# Patient Record
Sex: Male | Born: 1985
Health system: Southern US, Community
[De-identification: ages and names within clinical notes are randomized; demographics above are authoritative.]

## PROBLEM LIST (undated history)

## (undated) DIAGNOSIS — IMO0001 Reserved for inherently not codable concepts without codable children: Secondary | ICD-10-CM

## (undated) DIAGNOSIS — Z789 Other specified health status: Secondary | ICD-10-CM

## (undated) DIAGNOSIS — F419 Anxiety disorder, unspecified: Secondary | ICD-10-CM

## (undated) HISTORY — DX: Anxiety disorder, unspecified: F41.9

---

## 2018-01-01 NOTE — Progress Notes (Signed)
Chief Complaint  Patient presents with  . New Patient (Initial Visit)    ? 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 Anxiety     Current Outpatient Medications  Medication Sig Dispense Refill  . propranolol (INDERAL) 40 MG tablet Take 1 tablet (40 mg total) by mouth once for 1 dose. 30 tablet 0   No current facility-administered medications for this visit.     Allergies: No Known Allergies  History reviewed. No pertinent surgical history.  Social History   Socioeconomic History  . Marital status: Married    Spouse name: None  . Number of children: None  . Years of education: None  . Highest education level: None  Social Needs  . Financial resource strain: None  . Food insecurity - worry: None  . Food insecurity - inability: None  . Transportation needs - medical: None  . Transportation needs - non-medical: None  Occupational History  . None  Tobacco Use  . Smoking status: Never Smoker  . Smokeless tobacco: Never Used  Substance and Sexual Activity  . Alcohol use: Yes    Alcohol/week: 0.6 - 1.2 oz    Types: 1 - 2 Shots of liquor per week  . Drug use: No  . Sexual activity: Yes    Partners: Female    Birth control/protection: IUD  Other Topics Concern  . None  Social History Narrative  . None    Family History  Problem  Relation Age of Onset  . Diabetes Father   . Parkinson's disease Paternal Grandmother   . Diabetes Paternal Grandmother      ROS Review of Systems See HPI Constitution: No fevers or chills No malaise No diaphoresis Skin: No rash or itching Eyes: no blurry vision, no double vision GU: no dysuria or hematuria Neuro: no dizziness or headaches all others reviewed and negative   Objective: Vitals:   01/02/18 0818  BP: 122/70  Pulse: 92  Resp: 16  Temp: 98 F (36.7 C)  TempSrc: Oral  SpO2: 98%  Weight: 228 lb 3.2 oz (103.5 kg)  Height: 5' 7.5" (1.715 m)    Physical Exam  Constitutional: He is oriented to person, place, and time. He appears well-developed and well-nourished.  HENT:  Head: Normocephalic and atraumatic.  Eyes: Conjunctivae and EOM are normal.  Cardiovascular: Normal rate, regular rhythm and normal heart sounds.  No murmur heard. Pulmonary/Chest: Effort normal and breath sounds normal. No stridor. No respiratory distress.  Abdominal: Soft. Bowel sounds are normal. He exhibits no distension and no mass. There is no tenderness. There is no rebound and no guarding. No hernia. Hernia confirmed negative in the right inguinal area and confirmed negative in the left inguinal area.  No palpable defects  Musculoskeletal: Normal range of motion. He exhibits no edema.  Lymphadenopathy: No  inguinal adenopathy noted on the right or left side.  Neurological: He is alert and oriented to person, place, and time.  Skin: Skin is warm. Capillary refill takes less than 2 seconds.  Psychiatric: He has a normal mood and affect. His behavior is normal. Judgment and thought content normal.    Assessment and Plan Nichole was seen today for new patient (initial visit).  Diagnoses and all orders for this visit:  Hernia of abdominal wall- advised pt to return for xray since he has to leave -     DG Abd 1 View; Future  Anxiety state  Patient to continue propranolol and return  for physical  -     propranolol (INDERAL) 40 MG tablet; Take 1 tablet (40 mg total) by mouth once for 1 dose.     Eder Macek A Tasmia Blumer

## 2018-01-02 ENCOUNTER — Encounter: Payer: Self-pay | Admitting: Family Medicine

## 2018-01-02 ENCOUNTER — Ambulatory Visit (INDEPENDENT_AMBULATORY_CARE_PROVIDER_SITE_OTHER): Payer: BLUE CROSS/BLUE SHIELD | Admitting: Family Medicine

## 2018-01-02 ENCOUNTER — Other Ambulatory Visit: Payer: Self-pay

## 2018-01-02 ENCOUNTER — Ambulatory Visit: Payer: BLUE CROSS/BLUE SHIELD

## 2018-01-02 VITALS — BP 122/70 | HR 92 | Temp 98.0°F | Resp 16 | Ht 67.5 in | Wt 228.2 lb

## 2018-01-02 DIAGNOSIS — K439 Ventral hernia without obstruction or gangrene: Secondary | ICD-10-CM | POA: Diagnosis not present

## 2018-01-02 DIAGNOSIS — F411 Generalized anxiety disorder: Secondary | ICD-10-CM

## 2018-01-02 MED ORDER — PROPRANOLOL HCL 40 MG PO TABS: 40.0000 mg | ORAL_TABLET | Freq: Once | ORAL | 0 refills | Status: DC

## 2018-01-02 NOTE — Patient Instructions (Addendum)
Return for abdominal XRAY     IF you received an x-ray today, you will receive an invoice from [facility]. Please contact [facility] at [phone] with questions or concerns regarding your invoice.   IF you received labwork today, you will receive an invoice from [facility]. Please contact [facility] at [phone] with questions or concerns regarding your invoice.   Our billing staff will not be able to assist you with questions regarding bills from these companies.  You will be contacted with the lab results as soon as they are available. The fastest way to get your results is to activate your My Chart account. Instructions are located on the last page of this paperwork. If you have not heard from [place] regarding the results in 2 weeks, please contact this office.    Hernia, Adult A hernia is the bulging of an organ or tissue through a weak spot in the muscles of the abdomen (abdominal wall). Hernias develop most often near the navel or groin. There are many kinds of hernias. Common kinds include:  Femoral hernia. This kind of hernia develops under the groin in the upper thigh area.  Inguinal hernia. This kind of hernia develops in the groin or scrotum.  Umbilical hernia. This kind of hernia develops near the navel.  Hiatal hernia. This kind of hernia causes part of the stomach to be pushed up into the chest.  Incisional hernia. This kind of hernia bulges through a scar from an abdominal surgery.  What are the causes? This condition may be caused by:  Heavy lifting.  Coughing over a long period of time.  Straining to have a bowel movement.  An incision made during an abdominal surgery.  A birth defect (congenital defect).  Excess weight or obesity.  Smoking.  Poor nutrition.  Cystic fibrosis.  Excess fluid in the abdomen.  Undescended testicles.  What are the signs or symptoms? Symptoms of a hernia include:  A lump on the abdomen. This is the  first sign of a hernia. The lump may become more obvious with standing, straining, or coughing. It may get bigger over time if it is not treated or if the condition causing it is not treated.  Pain. A hernia is usually painless, but it may become painful over time if treatment is delayed. The pain is usually dull and may get worse with standing or lifting heavy objects.  Sometimes a hernia gets tightly squeezed in the weak spot (strangulated) or stuck there (incarcerated) and causes additional symptoms. These symptoms may include:  Vomiting.  Nausea.  Constipation.  Irritability.  How is this diagnosed? A hernia may be diagnosed with:  A physical exam. During the exam your health care provider may ask you to cough or to make a specific movement, because a hernia is usually more visible when you move.  Imaging tests. These can include: ? X-rays. ? Ultrasound. ? CT scan.  How is this treated? A hernia that is small and painless may not need to be treated. A hernia that is large or painful may be treated with surgery. Inguinal hernias may be treated with surgery to prevent incarceration or strangulation. Strangulated hernias are always treated with surgery, because lack of blood to the trapped organ or tissue can cause it to die. Surgery to treat a hernia involves pushing the bulge back into place and repairing the weak part of the abdomen. Follow these instructions at home:  Avoid straining.  Do not lift anything heavier than 10 lb (  4.5 kg).  Lift with your leg muscles, not your back muscles. This helps avoid strain.  When coughing, try to cough gently.  Prevent constipation. Constipation leads to straining with bowel movements, which can make a hernia worse or cause a hernia repair to break down. You can prevent constipation by: ? Eating a high-fiber diet that includes plenty of fruits and vegetables. ? Drinking enough fluids to keep your urine clear or pale yellow. Aim to drink  6-8 glasses of water per day. ? Using a stool softener as directed by your health care provider.  Lose weight, if you are overweight.  Do not use any tobacco products, including cigarettes, chewing tobacco, or electronic cigarettes. If you need help quitting, ask your health care provider.  Keep all follow-up visits as directed by your health care provider. This is important. Your health care provider may need to monitor your condition. Contact a health care provider if:  You have swelling, redness, and pain in the affected area.  Your bowel habits change. Get help right away if:  You have a fever.  You have abdominal pain that is getting worse.  You feel nauseous or you vomit.  You cannot push the hernia back in place by gently pressing on it while you are lying down.  The hernia: ? Changes in shape or size. ? Is stuck outside the abdomen. ? Becomes discolored. ? Feels hard or tender. This information is not intended to replace advice given to you by your health care provider. Make sure you discuss any questions you have with your health care provider. Document Released: 11/06/2005 Document Revised: 04/05/2016 Document Reviewed: 09/16/2014 Elsevier Interactive Patient Education  2017 [employer]

## 2018-02-01 ENCOUNTER — Other Ambulatory Visit: Payer: Self-pay | Admitting: Family Medicine

## 2018-04-01 ENCOUNTER — Encounter: Payer: BLUE CROSS/BLUE SHIELD | Admitting: Family Medicine

## 2018-04-29 ENCOUNTER — Other Ambulatory Visit: Payer: Self-pay | Admitting: Family Medicine

## 2018-04-29 NOTE — Telephone Encounter (Signed)
Last visit 01/02/18 No upcoming visits Last fill date 02/01/18

## 2018-04-29 NOTE — Telephone Encounter (Signed)
Inderal refill Last OV:01/02/18 Last refill:02/01/18 30 tab/0 refill MWN:[id number]:[phone][id number] [place]- [place], [place] - [address] [phone] (Phone) [phone] (Fax)

## 2018-10-28 ENCOUNTER — Encounter: Payer: Self-pay | Admitting: Family Medicine

## 2018-10-28 ENCOUNTER — Ambulatory Visit: Payer: BLUE CROSS/BLUE SHIELD | Admitting: Family Medicine

## 2018-10-28 ENCOUNTER — Other Ambulatory Visit: Payer: Self-pay

## 2018-10-28 VITALS — BP 139/83 | HR 74 | Temp 98.6°F | Resp 17 | Ht 67.5 in | Wt 226.0 lb

## 2018-10-28 DIAGNOSIS — Z113 Encounter for screening for infections with a predominantly sexual mode of transmission: Secondary | ICD-10-CM

## 2018-10-28 DIAGNOSIS — F4010 Social phobia, unspecified: Secondary | ICD-10-CM | POA: Insufficient documentation

## 2018-10-28 DIAGNOSIS — N489 Disorder of penis, unspecified: Secondary | ICD-10-CM | POA: Diagnosis not present

## 2018-10-28 MED ORDER — PROPRANOLOL HCL 40 MG PO TABS: 40.0000 mg | ORAL_TABLET | Freq: Every day | ORAL | 1 refills | Status: AC

## 2018-10-28 MED ORDER — DOXYCYCLINE HYCLATE 100 MG PO TABS: 100.0000 mg | ORAL_TABLET | Freq: Two times a day (BID) | ORAL | 0 refills | Status: AC

## 2018-10-28 NOTE — Progress Notes (Signed)
Established Patient Office Visit  Subjective:  Patient ID: Jonathan Garcia, male    DOB: [birth year]  Age: 32 y.o. MRN: [id number]  CC:  Chief Complaint  Patient presents with  . bump in groin (mid groin)    x 6 days, painful, pain level 8/10 and ibuprofen last night at 7 pm for pain.  . Medication Refill    propranolol    HPI Jonathan Garcia presents for  Penile lesion Patient reports that he started having a lesion on his penis for a week now States that he was having pain from the lesion so he squeezed it and having pain and discharge He is monogamous with his wife and she has been his only partner   Social anxiety Pt takes propranolol as needed for speeches or new encounters She reports that this helps him greatly Denies dizziness with meds  Past Medical History:  Diagnosis Date  . Anxiety     No past surgical history on file.  Family History  Problem Relation Age of Onset  . Diabetes Father   . Parkinson's disease Paternal Grandmother   . Diabetes Paternal Grandmother     Social History   Socioeconomic History  . Marital status: Married    Spouse name: Not on file  . Number of children: Not on file  . Years of education: Not on file  . Highest education level: Not on file  Occupational History  . Not on file  Social Needs  . Financial resource strain: Not on file  . Food insecurity:    Worry: Not on file    Inability: Not on file  . Transportation needs:    Medical: Not on file    Non-medical: Not on file  Tobacco Use  . Smoking status: Never Smoker  . Smokeless tobacco: Never Used  Substance and Sexual Activity  . Alcohol use: Yes    Alcohol/week: 1.0 - 2.0 standard drinks    Types: 1 - 2 Shots of liquor per week  . Drug use: No  . Sexual activity: Yes    Partners: Female    Birth control/protection: IUD  Lifestyle  . Physical activity:    Days per week: Not on file    Minutes per session: Not on file  . Stress: Not on file    Relationships  . Social connections:    Talks on phone: Not on file    Gets together: Not on file    Attends religious service: Not on file    Active member of club or organization: Not on file    Attends meetings of clubs or organizations: Not on file    Relationship status: Not on file  . Intimate partner violence:    Fear of current or ex partner: Not on file    Emotionally abused: Not on file    Physically abused: Not on file    Forced sexual activity: Not on file  Other Topics Concern  . Not on file  Social History Narrative  . Not on file    Outpatient Medications Prior to Visit  Medication Sig Dispense Refill  . propranolol (INDERAL) 40 MG tablet TAKE 1 TABLET BY MOUTH EVERY DAY 30 tablet 1   No facility-administered medications prior to visit.     No Known Allergies  ROS Review of Systems Review of Systems  Constitutional: Negative for activity change, appetite change, chills and fever.  HENT: Negative for congestion, nosebleeds, trouble swallowing and voice change.   Respiratory: Negative for  cough, shortness of breath and wheezing.   Gastrointestinal: Negative for diarrhea, nausea and vomiting.  Genitourinary: Negative for difficulty urinating, dysuria, flank pain and hematuria.  Musculoskeletal: Negative for back pain, joint swelling and neck pain.  Neurological: Negative for dizziness, speech difficulty, light-headedness and numbness.  See HPI. All other review of systems negative.     Objective:    Physical Exam  BP 139/83 (BP Location: Left Arm, Patient Position: Sitting, Cuff Size: Large)   Pulse 74   Temp 98.6 F (37 C) (Oral)   Resp 17   Ht 5' 7.5" (1.715 m)   Wt 226 lb (102.5 kg)   SpO2 100%   BMI 34.87 kg/m  Wt Readings from Last 3 Encounters:  10/28/18 226 lb (102.5 kg)  01/02/18 228 lb 3.2 oz (103.5 kg)   Physical Exam  Constitutional: Oriented to person, place, and time. Appears well-developed and well-nourished.  HENT:  Head:  Normocephalic and atraumatic.  Eyes: Conjunctivae and EOM are normal.  Cardiovascular: Normal rate, regular rhythm, normal heart sounds and intact distal pulses.  No murmur heard. Pulmonary/Chest: Effort normal and breath sounds normal. No stridor. No respiratory distress. Has no wheezes.  Neurological: Is alert and oriented to person, place, and time.  Skin: Skin is warm. Capillary refill takes less than 2 seconds.  Psychiatric: Has a normal mood and affect. Behavior is normal. Judgment and thought content normal.   Chaperone present  At the glans of the circumcised penis there is a cauliflower appearing lesion with some evidence of clotting With palpation there was yellow exudate and blood expressed Culture was collected  The lesion is on the left lateral aspect of the glans at 9o'clock With palpation of the scrotom there are no lesions, there are no masses in the inguinal canal Palpable inguinal lymph node noted on the left No ulcerations or vesicles noted No discharge from the penis  Health Maintenance Due  Topic Date Due  . HIV Screening  11/18/2001    There are no preventive care reminders to display for this patient.  No results found for: TSH No results found for: WBC, HGB, HCT, MCV, PLT No results found for: NA, K, CHLORIDE, CO2, GLUCOSE, BUN, CREATININE, BILITOT, ALKPHOS, AST, ALT, PROT, ALBUMIN, CALCIUM, ANIONGAP, EGFR, GFR No results found for: CHOL No results found for: HDL No results found for: LDLCALC No results found for: TRIG No results found for: CHOLHDL No results found for: HGBA1C    Assessment & Plan:   Problem List Items Addressed This Visit      Other   Social anxiety disorder    Refilled propranolol This is working well for him      Penile lesion - Primary    Will screen for stds and also send wound culture  Will await results for treatment      Relevant Medications   doxycycline (VIBRA-TABS) 100 MG tablet   Other Relevant Orders    Herpes simplex virus culture   HSV(herpes simplex vrs) 1+2 ab-IgG   WOUND CULTURE    Other Visit Diagnoses    Screening for STD (sexually transmitted disease)       Relevant Orders   Herpes simplex virus culture   HIV Antibody (routine testing w rflx)   HSV(herpes simplex vrs) 1+2 ab-IgG   Hepatitis B surface antigen   RPR   WOUND CULTURE   GC/Chlamydia Probe Amp      Meds ordered this encounter  Medications  . doxycycline (VIBRA-TABS) 100 MG tablet  Sig: Take 1 tablet (100 mg total) by mouth 2 (two) times daily for 10 days.    Dispense:  20 tablet    Refill:  0  . propranolol (INDERAL) 40 MG tablet    Sig: Take 1 tablet (40 mg total) by mouth daily.    Dispense:  30 tablet    Refill:  1    Need office visit for additional refills.    Follow-up: No follow-ups on file.    Forrest Moron, MD

## 2018-10-28 NOTE — Assessment & Plan Note (Signed)
Refilled propranolol This is working well for him

## 2018-10-28 NOTE — Assessment & Plan Note (Signed)
Will screen for stds and also send wound culture  Will await results for treatment

## 2018-10-28 NOTE — Patient Instructions (Signed)
° ° ° °  If you have lab work done today you will be contacted with your lab results within the next 2 weeks.  If you have not heard from us then please contact us. The fastest way to get your results is to register for My Chart. ° ° °IF you received an x-ray today, you will receive an invoice from [facility]. Please contact [facility] at [phone] with questions or concerns regarding your invoice.  ° °IF you received labwork today, you will receive an invoice from [facility]. Please contact [facility] at [phone] with questions or concerns regarding your invoice.  ° °Our billing staff will not be able to assist you with questions regarding bills from these companies. ° °You will be contacted with the lab results as soon as they are available. The fastest way to get your results is to activate your My Chart account. Instructions are located on the last page of this paperwork. If you have not heard from us regarding the results in 2 weeks, please contact this office. °  ° ° ° °

## 2018-10-29 LAB — HSV(HERPES SIMPLEX VRS) I + II AB-IGG
HSV 1 Glycoprotein G Ab, IgG: <0.91 index (ref 0.00–0.90)
HSV 2 IgG, Type Spec: <0.91 index (ref 0.00–0.90)

## 2018-10-29 LAB — RPR: RPR: NONREACTIVE

## 2018-10-29 LAB — HEPATITIS B SURFACE ANTIGEN: HEP B S AG: NEGATIVE

## 2018-10-29 LAB — HIV ANTIBODY (ROUTINE TESTING W REFLEX): HIV Screen 4th Generation wRfx: NONREACTIVE

## 2018-10-30 LAB — WOUND CULTURE

## 2018-10-30 LAB — HERPES SIMPLEX VIRUS CULTURE

## 2018-10-30 LAB — GC/CHLAMYDIA PROBE AMP
Chlamydia trachomatis, NAA: NEGATIVE
Neisseria gonorrhoeae by PCR: NEGATIVE

## 2019-09-08 ENCOUNTER — Other Ambulatory Visit: Payer: Self-pay

## 2019-09-08 ENCOUNTER — Ambulatory Visit: Payer: BC Managed Care – PPO | Admitting: Family Medicine

## 2019-09-08 ENCOUNTER — Encounter: Payer: Self-pay | Admitting: Family Medicine

## 2019-09-08 VITALS — BP 135/79 | HR 85 | Temp 98.4°F | Resp 16 | Ht 67.72 in | Wt 223.0 lb

## 2019-09-08 DIAGNOSIS — M6289 Other specified disorders of muscle: Secondary | ICD-10-CM

## 2019-09-08 DIAGNOSIS — Z3009 Encounter for other general counseling and advice on contraception: Secondary | ICD-10-CM | POA: Diagnosis not present

## 2019-09-08 NOTE — Progress Notes (Signed)
Established Patient Office Visit  Subjective:  Patient ID: Jonathan Garcia, male    DOB: 1986-11-11  Age: 33 y.o. MRN: [id number]  CC:  Chief Complaint  Patient presents with  . Leg Pain    in both legs x 4 months. Pt states he feels like it is poor blood cirulation. More at the calf area     HPI Jonathan Garcia presents for   Pt reports that he feels like his legs get tired and feels like his legs are tight and he is standing 6 out of 8 hours He states that his legs feel tired and sometimes he misses steps going up He reports that at the top of the steps his legs wear out  He reports that any strenuous activities and the legs feel tired  He denies trembling in his legs He only feels this in the calves He states that his father had circulation issues with his legs and he is concerned given his sickle cell trait.   He denies hip and groin pain  His toes do not turn blue or get discolored  Contraception Patient reports that he and his wife have discussed permanent contraception He is very sure he would want to proceed with vasectomy and desires a referral He has never wanted children and is aware this permanent   Past Medical History:  Diagnosis Date  . Anxiety     History reviewed. No pertinent surgical history.  Family History  Problem Relation Age of Onset  . Diabetes Father   . Parkinson's disease Paternal Grandmother   . Diabetes Paternal Grandmother     Social History   Socioeconomic History  . Marital status: Married    Spouse name: Not on file  . Number of children: Not on file  . Years of education: Not on file  . Highest education level: Not on file  Occupational History  . Not on file  Social Needs  . Financial resource strain: Not on file  . Food insecurity    Worry: Not on file    Inability: Not on file  . Transportation needs    Medical: Not on file    Non-medical: Not on file  Tobacco Use  . Smoking status: Never Smoker  . Smokeless  tobacco: Never Used  Substance and Sexual Activity  . Alcohol use: Yes    Alcohol/week: 1.0 - 2.0 standard drinks    Types: 1 - 2 Shots of liquor per week  . Drug use: No  . Sexual activity: Yes    Partners: Female    Birth control/protection: I.U.D.  Lifestyle  . Physical activity    Days per week: Not on file    Minutes per session: Not on file  . Stress: Not on file  Relationships  . Social Herbalist on phone: Not on file    Gets together: Not on file    Attends religious service: Not on file    Active member of club or organization: Not on file    Attends meetings of clubs or organizations: Not on file    Relationship status: Not on file  . Intimate partner violence    Fear of current or ex partner: Not on file    Emotionally abused: Not on file    Physically abused: Not on file    Forced sexual activity: Not on file  Other Topics Concern  . Not on file  Social History Narrative  . Not on file  Outpatient Medications Prior to Visit  Medication Sig Dispense Refill  . propranolol (INDERAL) 40 MG tablet Take 1 tablet (40 mg total) by mouth daily. 30 tablet 1   No facility-administered medications prior to visit.     No Known Allergies  ROS Review of Systems Review of Systems  Constitutional: Negative for activity change, appetite change, chills and fever.  HENT: Negative for congestion, nosebleeds, trouble swallowing and voice change.   Respiratory: Negative for cough, shortness of breath and wheezing.   Gastrointestinal: Negative for diarrhea, nausea and vomiting.  Genitourinary: Negative for difficulty urinating, dysuria, flank pain and hematuria.  Musculoskeletal: Negative for back pain, joint swelling and neck pain.  Neurological: Negative for dizziness, speech difficulty, light-headedness and numbness.  See HPI. All other review of systems negative.     Objective:    Physical Exam  BP 135/79   Pulse 85   Temp 98.4 F (36.9 C) (Oral)    Resp 16   Ht 5' 7.72" (1.72 m)   Wt 223 lb (101.2 kg)   SpO2 99%   BMI 34.19 kg/m  Wt Readings from Last 3 Encounters:  09/08/19 223 lb (101.2 kg)  10/28/18 226 lb (102.5 kg)  01/02/18 228 lb 3.2 oz (103.5 kg)   Physical Exam  Constitutional: Oriented to person, place, and time. Appears well-developed and well-nourished.  HENT:  Head: Normocephalic and atraumatic.  Eyes: Conjunctivae and EOM are normal.  Cardiovascular: Normal rate, regular rhythm, normal heart sounds and intact distal pulses.  No murmur heard. Pulmonary/Chest: Effort normal and breath sounds normal. No stridor. No respiratory distress. Has no wheezes.  Musculoskeletal: no calf pain, cap refill <2s, no cyanosis, no clubbing Neurological: Is alert and oriented to person, place, and time.  Skin: Skin is warm. Capillary refill takes less than 2 seconds.  Psychiatric: Has a normal mood and affect. Behavior is normal. Judgment and thought content normal.    There are no preventive care reminders to display for this patient.  There are no preventive care reminders to display for this patient.     Assessment & Plan:   Problem List Items Addressed This Visit    None    Visit Diagnoses    Muscle fatigue    -  Primary -  Discussed electrolyte deficiency as a cause  -  Discussed that that he should get his labs checked to look for deficiency His sickle cell trait is not a likely cause    Relevant Orders   Basic metabolic panel   Magnesium   Phosphorus   Encounter for vasectomy counseling    - discussed that this is a permanent form of contraception  Discussed that the expected course and also discussed risks of vasectomy He is well aware and confident in his decision   Relevant Orders   Ambulatory referral to Urology      No orders of the defined types were placed in this encounter.   Follow-up: No follow-ups on file.    Doristine Bosworth, MD

## 2019-09-08 NOTE — Patient Instructions (Signed)
° ° ° °  If you have lab work done today you will be contacted with your lab results within the next 2 weeks.  If you have not heard from us then please contact us. The fastest way to get your results is to register for My Chart. ° ° °IF you received an x-ray today, you will receive an invoice from [facility]. Please contact [facility] at [phone] with questions or concerns regarding your invoice.  ° °IF you received labwork today, you will receive an invoice from [facility]. Please contact [facility] at [phone] with questions or concerns regarding your invoice.  ° °Our billing staff will not be able to assist you with questions regarding bills from these companies. ° °You will be contacted with the lab results as soon as they are available. The fastest way to get your results is to activate your My Chart account. Instructions are located on the last page of this paperwork. If you have not heard from us regarding the results in 2 weeks, please contact this office. °  ° ° ° °

## 2019-09-09 LAB — BASIC METABOLIC PANEL
BUN/Creatinine Ratio: 12 (ref 9–20)
BUN: 15 mg/dL (ref 6–20)
CO2: 24 mmol/L (ref 20–29)
Calcium: 9.6 mg/dL (ref 8.7–10.2)
Chloride: 100 mmol/L (ref 96–106)
Creatinine, Ser: 1.24 mg/dL (ref 0.76–1.27)
GFR calc Af Amer: 88 mL/min/{1.73_m2} (ref >59)
GFR calc non Af Amer: 76 mL/min/{1.73_m2} (ref >59)
Glucose: 88 mg/dL (ref 65–99)
Potassium: 4.1 mmol/L (ref 3.5–5.2)
Sodium: 140 mmol/L (ref 134–144)

## 2019-09-09 LAB — MAGNESIUM: Magnesium: 2 mg/dL (ref 1.6–2.3)

## 2019-09-09 LAB — CBC
Hematocrit: 42.4 % (ref 37.5–51.0)
Hemoglobin: 13.7 g/dL (ref 13.0–17.7)
MCH: 26.6 pg (ref 26.6–33.0)
MCHC: 32.3 g/dL (ref 31.5–35.7)
MCV: 82 fL (ref 79–97)
Platelets: 338 10*3/uL (ref 150–450)
RBC: 5.15 x10E6/uL (ref 4.14–5.80)
RDW: 11.4 % — ABNORMAL LOW (ref 11.6–15.4)
WBC: 10 10*3/uL (ref 3.4–10.8)

## 2019-09-09 LAB — PHOSPHORUS: Phosphorus: 3.9 mg/dL (ref 2.8–4.1)

## 2019-09-30 DIAGNOSIS — Z3009 Encounter for other general counseling and advice on contraception: Secondary | ICD-10-CM | POA: Diagnosis not present

## 2019-11-25 DIAGNOSIS — Z1159 Encounter for screening for other viral diseases: Secondary | ICD-10-CM | POA: Diagnosis not present

## 2019-11-25 DIAGNOSIS — Z20828 Contact with and (suspected) exposure to other viral communicable diseases: Secondary | ICD-10-CM | POA: Diagnosis not present

## 2019-12-08 DIAGNOSIS — Z20828 Contact with and (suspected) exposure to other viral communicable diseases: Secondary | ICD-10-CM | POA: Diagnosis not present

## 2019-12-08 DIAGNOSIS — Z1159 Encounter for screening for other viral diseases: Secondary | ICD-10-CM | POA: Diagnosis not present

## 2019-12-11 DIAGNOSIS — Z1159 Encounter for screening for other viral diseases: Secondary | ICD-10-CM | POA: Diagnosis not present

## 2019-12-11 DIAGNOSIS — Z20828 Contact with and (suspected) exposure to other viral communicable diseases: Secondary | ICD-10-CM | POA: Diagnosis not present

## 2019-12-15 DIAGNOSIS — Z20828 Contact with and (suspected) exposure to other viral communicable diseases: Secondary | ICD-10-CM | POA: Diagnosis not present

## 2019-12-15 DIAGNOSIS — Z1159 Encounter for screening for other viral diseases: Secondary | ICD-10-CM | POA: Diagnosis not present

## 2019-12-18 DIAGNOSIS — Z1159 Encounter for screening for other viral diseases: Secondary | ICD-10-CM | POA: Diagnosis not present

## 2019-12-18 DIAGNOSIS — Z20828 Contact with and (suspected) exposure to other viral communicable diseases: Secondary | ICD-10-CM | POA: Diagnosis not present

## 2019-12-22 DIAGNOSIS — Z1159 Encounter for screening for other viral diseases: Secondary | ICD-10-CM | POA: Diagnosis not present

## 2019-12-22 DIAGNOSIS — Z20828 Contact with and (suspected) exposure to other viral communicable diseases: Secondary | ICD-10-CM | POA: Diagnosis not present

## 2019-12-25 DIAGNOSIS — Z20828 Contact with and (suspected) exposure to other viral communicable diseases: Secondary | ICD-10-CM | POA: Diagnosis not present

## 2019-12-25 DIAGNOSIS — Z1159 Encounter for screening for other viral diseases: Secondary | ICD-10-CM | POA: Diagnosis not present

## 2019-12-29 DIAGNOSIS — Z1159 Encounter for screening for other viral diseases: Secondary | ICD-10-CM | POA: Diagnosis not present

## 2019-12-29 DIAGNOSIS — Z20828 Contact with and (suspected) exposure to other viral communicable diseases: Secondary | ICD-10-CM | POA: Diagnosis not present

## 2020-01-01 DIAGNOSIS — Z20828 Contact with and (suspected) exposure to other viral communicable diseases: Secondary | ICD-10-CM | POA: Diagnosis not present

## 2020-01-01 DIAGNOSIS — Z1159 Encounter for screening for other viral diseases: Secondary | ICD-10-CM | POA: Diagnosis not present

## 2020-01-05 DIAGNOSIS — Z20828 Contact with and (suspected) exposure to other viral communicable diseases: Secondary | ICD-10-CM | POA: Diagnosis not present

## 2020-01-05 DIAGNOSIS — Z1159 Encounter for screening for other viral diseases: Secondary | ICD-10-CM | POA: Diagnosis not present

## 2020-01-12 DIAGNOSIS — Z1159 Encounter for screening for other viral diseases: Secondary | ICD-10-CM | POA: Diagnosis not present

## 2020-01-12 DIAGNOSIS — Z20828 Contact with and (suspected) exposure to other viral communicable diseases: Secondary | ICD-10-CM | POA: Diagnosis not present

## 2020-02-02 DIAGNOSIS — Z20828 Contact with and (suspected) exposure to other viral communicable diseases: Secondary | ICD-10-CM | POA: Diagnosis not present

## 2020-02-02 DIAGNOSIS — Z1159 Encounter for screening for other viral diseases: Secondary | ICD-10-CM | POA: Diagnosis not present

## 2020-02-12 DIAGNOSIS — Z20828 Contact with and (suspected) exposure to other viral communicable diseases: Secondary | ICD-10-CM | POA: Diagnosis not present

## 2020-02-12 DIAGNOSIS — Z1159 Encounter for screening for other viral diseases: Secondary | ICD-10-CM | POA: Diagnosis not present

## 2020-02-26 DIAGNOSIS — Z20828 Contact with and (suspected) exposure to other viral communicable diseases: Secondary | ICD-10-CM | POA: Diagnosis not present

## 2020-02-26 DIAGNOSIS — Z1159 Encounter for screening for other viral diseases: Secondary | ICD-10-CM | POA: Diagnosis not present

## 2020-03-01 DIAGNOSIS — Z20828 Contact with and (suspected) exposure to other viral communicable diseases: Secondary | ICD-10-CM | POA: Diagnosis not present

## 2020-03-01 DIAGNOSIS — Z1159 Encounter for screening for other viral diseases: Secondary | ICD-10-CM | POA: Diagnosis not present

## 2020-03-18 DIAGNOSIS — Z1159 Encounter for screening for other viral diseases: Secondary | ICD-10-CM | POA: Diagnosis not present

## 2020-03-18 DIAGNOSIS — Z20828 Contact with and (suspected) exposure to other viral communicable diseases: Secondary | ICD-10-CM | POA: Diagnosis not present

## 2020-03-22 DIAGNOSIS — Z1159 Encounter for screening for other viral diseases: Secondary | ICD-10-CM | POA: Diagnosis not present

## 2020-03-22 DIAGNOSIS — Z20828 Contact with and (suspected) exposure to other viral communicable diseases: Secondary | ICD-10-CM | POA: Diagnosis not present

## 2020-04-05 DIAGNOSIS — Z20828 Contact with and (suspected) exposure to other viral communicable diseases: Secondary | ICD-10-CM | POA: Diagnosis not present

## 2020-04-05 DIAGNOSIS — Z1159 Encounter for screening for other viral diseases: Secondary | ICD-10-CM | POA: Diagnosis not present

## 2020-04-12 DIAGNOSIS — Z20828 Contact with and (suspected) exposure to other viral communicable diseases: Secondary | ICD-10-CM | POA: Diagnosis not present

## 2020-04-12 DIAGNOSIS — Z1159 Encounter for screening for other viral diseases: Secondary | ICD-10-CM | POA: Diagnosis not present

## 2020-04-21 DIAGNOSIS — Z3040 Encounter for surveillance of contraceptives, unspecified: Secondary | ICD-10-CM | POA: Diagnosis not present

## 2020-04-21 DIAGNOSIS — N478 Other disorders of prepuce: Secondary | ICD-10-CM | POA: Diagnosis not present

## 2020-04-21 DIAGNOSIS — Z760 Encounter for issue of repeat prescription: Secondary | ICD-10-CM | POA: Diagnosis not present

## 2020-04-22 DIAGNOSIS — Z1159 Encounter for screening for other viral diseases: Secondary | ICD-10-CM | POA: Diagnosis not present

## 2020-04-22 DIAGNOSIS — Z20828 Contact with and (suspected) exposure to other viral communicable diseases: Secondary | ICD-10-CM | POA: Diagnosis not present

## 2020-04-29 DIAGNOSIS — Z1159 Encounter for screening for other viral diseases: Secondary | ICD-10-CM | POA: Diagnosis not present

## 2020-04-29 DIAGNOSIS — Z20828 Contact with and (suspected) exposure to other viral communicable diseases: Secondary | ICD-10-CM | POA: Diagnosis not present

## 2020-05-13 DIAGNOSIS — Z1159 Encounter for screening for other viral diseases: Secondary | ICD-10-CM | POA: Diagnosis not present

## 2020-05-13 DIAGNOSIS — Z20828 Contact with and (suspected) exposure to other viral communicable diseases: Secondary | ICD-10-CM | POA: Diagnosis not present

## 2020-05-17 DIAGNOSIS — Z1159 Encounter for screening for other viral diseases: Secondary | ICD-10-CM | POA: Diagnosis not present

## 2020-05-17 DIAGNOSIS — Z20828 Contact with and (suspected) exposure to other viral communicable diseases: Secondary | ICD-10-CM | POA: Diagnosis not present

## 2020-05-27 DIAGNOSIS — Z1159 Encounter for screening for other viral diseases: Secondary | ICD-10-CM | POA: Diagnosis not present

## 2020-05-27 DIAGNOSIS — Z20828 Contact with and (suspected) exposure to other viral communicable diseases: Secondary | ICD-10-CM | POA: Diagnosis not present

## 2020-06-07 DIAGNOSIS — Z1159 Encounter for screening for other viral diseases: Secondary | ICD-10-CM | POA: Diagnosis not present

## 2020-06-07 DIAGNOSIS — Z20828 Contact with and (suspected) exposure to other viral communicable diseases: Secondary | ICD-10-CM | POA: Diagnosis not present

## 2020-06-14 DIAGNOSIS — Z1159 Encounter for screening for other viral diseases: Secondary | ICD-10-CM | POA: Diagnosis not present

## 2020-06-14 DIAGNOSIS — Z20828 Contact with and (suspected) exposure to other viral communicable diseases: Secondary | ICD-10-CM | POA: Diagnosis not present

## 2020-06-21 DIAGNOSIS — Z1159 Encounter for screening for other viral diseases: Secondary | ICD-10-CM | POA: Diagnosis not present

## 2020-06-21 DIAGNOSIS — Z20828 Contact with and (suspected) exposure to other viral communicable diseases: Secondary | ICD-10-CM | POA: Diagnosis not present

## 2020-06-24 DIAGNOSIS — Z1159 Encounter for screening for other viral diseases: Secondary | ICD-10-CM | POA: Diagnosis not present

## 2020-06-24 DIAGNOSIS — Z20828 Contact with and (suspected) exposure to other viral communicable diseases: Secondary | ICD-10-CM | POA: Diagnosis not present

## 2020-07-05 DIAGNOSIS — Z1159 Encounter for screening for other viral diseases: Secondary | ICD-10-CM | POA: Diagnosis not present

## 2020-07-05 DIAGNOSIS — Z20828 Contact with and (suspected) exposure to other viral communicable diseases: Secondary | ICD-10-CM | POA: Diagnosis not present

## 2020-10-21 DIAGNOSIS — Z1159 Encounter for screening for other viral diseases: Secondary | ICD-10-CM | POA: Diagnosis not present

## 2020-10-21 DIAGNOSIS — Z20828 Contact with and (suspected) exposure to other viral communicable diseases: Secondary | ICD-10-CM | POA: Diagnosis not present

## 2020-10-25 DIAGNOSIS — Z1159 Encounter for screening for other viral diseases: Secondary | ICD-10-CM | POA: Diagnosis not present

## 2020-10-25 DIAGNOSIS — Z20828 Contact with and (suspected) exposure to other viral communicable diseases: Secondary | ICD-10-CM | POA: Diagnosis not present

## 2020-11-01 DIAGNOSIS — Z1159 Encounter for screening for other viral diseases: Secondary | ICD-10-CM | POA: Diagnosis not present

## 2020-11-01 DIAGNOSIS — Z20828 Contact with and (suspected) exposure to other viral communicable diseases: Secondary | ICD-10-CM | POA: Diagnosis not present

## 2020-11-11 DIAGNOSIS — Z1159 Encounter for screening for other viral diseases: Secondary | ICD-10-CM | POA: Diagnosis not present

## 2020-11-11 DIAGNOSIS — Z20828 Contact with and (suspected) exposure to other viral communicable diseases: Secondary | ICD-10-CM | POA: Diagnosis not present

## 2020-11-15 DIAGNOSIS — Z1159 Encounter for screening for other viral diseases: Secondary | ICD-10-CM | POA: Diagnosis not present

## 2020-11-15 DIAGNOSIS — Z20828 Contact with and (suspected) exposure to other viral communicable diseases: Secondary | ICD-10-CM | POA: Diagnosis not present

## 2020-11-22 DIAGNOSIS — Z1159 Encounter for screening for other viral diseases: Secondary | ICD-10-CM | POA: Diagnosis not present

## 2020-11-22 DIAGNOSIS — Z20828 Contact with and (suspected) exposure to other viral communicable diseases: Secondary | ICD-10-CM | POA: Diagnosis not present

## 2020-11-29 DIAGNOSIS — Z20828 Contact with and (suspected) exposure to other viral communicable diseases: Secondary | ICD-10-CM | POA: Diagnosis not present

## 2020-11-29 DIAGNOSIS — Z1159 Encounter for screening for other viral diseases: Secondary | ICD-10-CM | POA: Diagnosis not present

## 2020-12-02 DIAGNOSIS — Z1159 Encounter for screening for other viral diseases: Secondary | ICD-10-CM | POA: Diagnosis not present

## 2020-12-02 DIAGNOSIS — Z20828 Contact with and (suspected) exposure to other viral communicable diseases: Secondary | ICD-10-CM | POA: Diagnosis not present

## 2020-12-06 DIAGNOSIS — Z20828 Contact with and (suspected) exposure to other viral communicable diseases: Secondary | ICD-10-CM | POA: Diagnosis not present

## 2020-12-06 DIAGNOSIS — Z1159 Encounter for screening for other viral diseases: Secondary | ICD-10-CM | POA: Diagnosis not present

## 2020-12-13 DIAGNOSIS — Z1159 Encounter for screening for other viral diseases: Secondary | ICD-10-CM | POA: Diagnosis not present

## 2020-12-13 DIAGNOSIS — Z20828 Contact with and (suspected) exposure to other viral communicable diseases: Secondary | ICD-10-CM | POA: Diagnosis not present

## 2020-12-16 DIAGNOSIS — Z20828 Contact with and (suspected) exposure to other viral communicable diseases: Secondary | ICD-10-CM | POA: Diagnosis not present

## 2020-12-16 DIAGNOSIS — Z1159 Encounter for screening for other viral diseases: Secondary | ICD-10-CM | POA: Diagnosis not present

## 2020-12-30 DIAGNOSIS — Z20828 Contact with and (suspected) exposure to other viral communicable diseases: Secondary | ICD-10-CM | POA: Diagnosis not present

## 2020-12-30 DIAGNOSIS — Z1159 Encounter for screening for other viral diseases: Secondary | ICD-10-CM | POA: Diagnosis not present

## 2021-01-06 DIAGNOSIS — Z1159 Encounter for screening for other viral diseases: Secondary | ICD-10-CM | POA: Diagnosis not present

## 2021-01-06 DIAGNOSIS — Z20828 Contact with and (suspected) exposure to other viral communicable diseases: Secondary | ICD-10-CM | POA: Diagnosis not present

## 2021-01-10 DIAGNOSIS — Z20828 Contact with and (suspected) exposure to other viral communicable diseases: Secondary | ICD-10-CM | POA: Diagnosis not present

## 2021-01-10 DIAGNOSIS — Z1159 Encounter for screening for other viral diseases: Secondary | ICD-10-CM | POA: Diagnosis not present

## 2021-01-27 DIAGNOSIS — Z20828 Contact with and (suspected) exposure to other viral communicable diseases: Secondary | ICD-10-CM | POA: Diagnosis not present

## 2021-01-27 DIAGNOSIS — Z1159 Encounter for screening for other viral diseases: Secondary | ICD-10-CM | POA: Diagnosis not present

## 2021-02-03 DIAGNOSIS — Z20828 Contact with and (suspected) exposure to other viral communicable diseases: Secondary | ICD-10-CM | POA: Diagnosis not present

## 2021-02-03 DIAGNOSIS — Z1159 Encounter for screening for other viral diseases: Secondary | ICD-10-CM | POA: Diagnosis not present

## 2021-02-17 DIAGNOSIS — Z20828 Contact with and (suspected) exposure to other viral communicable diseases: Secondary | ICD-10-CM | POA: Diagnosis not present

## 2021-02-17 DIAGNOSIS — Z1159 Encounter for screening for other viral diseases: Secondary | ICD-10-CM | POA: Diagnosis not present

## 2021-03-07 DIAGNOSIS — Z20828 Contact with and (suspected) exposure to other viral communicable diseases: Secondary | ICD-10-CM | POA: Diagnosis not present

## 2021-03-07 DIAGNOSIS — Z1159 Encounter for screening for other viral diseases: Secondary | ICD-10-CM | POA: Diagnosis not present

## 2021-03-11 ENCOUNTER — Emergency Department (HOSPITAL_COMMUNITY)
Admission: EM | Admit: 2021-03-11 | Discharge: 2021-03-12 | Disposition: A | Discharge status: Home / Self Care | Payer: BC Managed Care – PPO | Attending: Emergency Medicine | Admitting: Emergency Medicine

## 2021-03-11 ENCOUNTER — Emergency Department (HOSPITAL_COMMUNITY): Payer: BC Managed Care – PPO

## 2021-03-11 ENCOUNTER — Other Ambulatory Visit: Payer: Self-pay

## 2021-03-11 ENCOUNTER — Encounter (HOSPITAL_COMMUNITY): Payer: Self-pay | Admitting: *Deleted

## 2021-03-11 DIAGNOSIS — S82141A Displaced bicondylar fracture of right tibia, initial encounter for closed fracture: Secondary | ICD-10-CM | POA: Insufficient documentation

## 2021-03-11 DIAGNOSIS — F329 Major depressive disorder, single episode, unspecified: Secondary | ICD-10-CM | POA: Diagnosis not present

## 2021-03-11 DIAGNOSIS — R609 Edema, unspecified: Secondary | ICD-10-CM | POA: Diagnosis not present

## 2021-03-11 DIAGNOSIS — R001 Bradycardia, unspecified: Secondary | ICD-10-CM | POA: Diagnosis not present

## 2021-03-11 DIAGNOSIS — S82121A Displaced fracture of lateral condyle of right tibia, initial encounter for closed fracture: Secondary | ICD-10-CM | POA: Diagnosis not present

## 2021-03-11 DIAGNOSIS — Y92410 Unspecified street and highway as the place of occurrence of the external cause: Secondary | ICD-10-CM | POA: Diagnosis not present

## 2021-03-11 DIAGNOSIS — Z23 Encounter for immunization: Secondary | ICD-10-CM | POA: Diagnosis not present

## 2021-03-11 DIAGNOSIS — I959 Hypotension, unspecified: Secondary | ICD-10-CM | POA: Diagnosis not present

## 2021-03-11 DIAGNOSIS — M25561 Pain in right knee: Secondary | ICD-10-CM | POA: Diagnosis not present

## 2021-03-11 DIAGNOSIS — R0902 Hypoxemia: Secondary | ICD-10-CM | POA: Diagnosis not present

## 2021-03-11 DIAGNOSIS — M2548 Effusion, other site: Secondary | ICD-10-CM | POA: Diagnosis not present

## 2021-03-11 DIAGNOSIS — M25461 Effusion, right knee: Secondary | ICD-10-CM | POA: Diagnosis not present

## 2021-03-11 DIAGNOSIS — S82141B Displaced bicondylar fracture of right tibia, initial encounter for open fracture type I or II: Secondary | ICD-10-CM | POA: Diagnosis not present

## 2021-03-11 DIAGNOSIS — S8991XA Unspecified injury of right lower leg, initial encounter: Secondary | ICD-10-CM | POA: Diagnosis not present

## 2021-03-11 MED ORDER — TETANUS-DIPHTH-ACELL PERTUSSIS 5-2.5-18.5 LF-MCG/0.5 IM SUSY
0.5000 mL | PREFILLED_SYRINGE | Freq: Once | INTRAMUSCULAR | Status: AC
  Administered 2021-03-11: 0.5 mL via INTRAMUSCULAR
  Filled 2021-03-11: qty 0.5

## 2021-03-11 MED ORDER — OXYCODONE-ACETAMINOPHEN 5-325 MG PO TABS: 1.0000 | ORAL_TABLET | Freq: Four times a day (QID) | ORAL | 0 refills | Status: DC | PRN

## 2021-03-11 MED ORDER — OXYCODONE-ACETAMINOPHEN 5-325 MG PO TABS
1.0000 | ORAL_TABLET | Freq: Once | ORAL | Status: AC
Start: 2021-03-11 — End: 2021-03-11
  Administered 2021-03-11: 1 via ORAL
  Filled 2021-03-11: qty 1

## 2021-03-11 NOTE — Discharge Instructions (Signed)
Where the knee immobilizer whenever you are up. Do not put any weight on your right leg. Get rechecked if you have uncontrolled pain, numbness or new concerning symptoms.

## 2021-03-11 NOTE — Progress Notes (Signed)
Orthopedic Tech Progress Note Patient Details:  Jonathan Garcia 06-Jan-1986 [id number]  Ortho Devices Type of Ortho Device: Crutches,Knee Immobilizer Ortho Device/Splint Location: RLE Ortho Device/Splint Interventions: Ordered,Application   Post Interventions Patient Tolerated: Well Instructions Provided: Adjustment of device   Maurene Capes 03/11/2021, 11:22 PM
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
## 2021-03-12 NOTE — ED Notes (Signed)
Abrasions to left arm and right 5th finger cleaned with NS, applied xeroform, and covered with gauze. Secured with medipore tape.

## 2021-03-14 DIAGNOSIS — S82141A Displaced bicondylar fracture of right tibia, initial encounter for closed fracture: Secondary | ICD-10-CM | POA: Diagnosis not present

## 2021-03-15 ENCOUNTER — Other Ambulatory Visit: Payer: Self-pay | Admitting: Orthopedic Surgery

## 2021-03-15 DIAGNOSIS — S82131A Displaced fracture of medial condyle of right tibia, initial encounter for closed fracture: Secondary | ICD-10-CM

## 2021-03-16 ENCOUNTER — Ambulatory Visit
Admission: RE | Admit: 2021-03-16 | Discharge: 2021-03-16 | Disposition: A | Discharge status: Home / Self Care | Payer: BC Managed Care – PPO | Source: Ambulatory Visit | Attending: Orthopedic Surgery | Admitting: Orthopedic Surgery

## 2021-03-16 ENCOUNTER — Other Ambulatory Visit: Payer: Self-pay

## 2021-03-16 DIAGNOSIS — S82141A Displaced bicondylar fracture of right tibia, initial encounter for closed fracture: Secondary | ICD-10-CM | POA: Diagnosis not present

## 2021-03-16 DIAGNOSIS — S83411A Sprain of medial collateral ligament of right knee, initial encounter: Secondary | ICD-10-CM | POA: Diagnosis not present

## 2021-03-16 DIAGNOSIS — S82131A Displaced fracture of medial condyle of right tibia, initial encounter for closed fracture: Secondary | ICD-10-CM

## 2021-03-21 ENCOUNTER — Encounter (HOSPITAL_COMMUNITY): Payer: Self-pay | Admitting: Orthopedic Surgery

## 2021-03-21 ENCOUNTER — Other Ambulatory Visit (HOSPITAL_COMMUNITY)
Admission: RE | Admit: 2021-03-21 | Discharge: 2021-03-21 | Disposition: A | Discharge status: Home / Self Care | Payer: BC Managed Care – PPO | Source: Ambulatory Visit | Attending: Orthopedic Surgery | Admitting: Orthopedic Surgery

## 2021-03-21 ENCOUNTER — Other Ambulatory Visit: Payer: Self-pay

## 2021-03-21 DIAGNOSIS — Z79899 Other long term (current) drug therapy: Secondary | ICD-10-CM | POA: Diagnosis not present

## 2021-03-21 DIAGNOSIS — E559 Vitamin D deficiency, unspecified: Secondary | ICD-10-CM | POA: Diagnosis not present

## 2021-03-21 DIAGNOSIS — S82101D Unspecified fracture of upper end of right tibia, subsequent encounter for closed fracture with routine healing: Secondary | ICD-10-CM | POA: Diagnosis not present

## 2021-03-21 DIAGNOSIS — E8889 Other specified metabolic disorders: Secondary | ICD-10-CM | POA: Diagnosis not present

## 2021-03-21 DIAGNOSIS — Z20822 Contact with and (suspected) exposure to covid-19: Secondary | ICD-10-CM | POA: Diagnosis not present

## 2021-03-21 DIAGNOSIS — S82121A Displaced fracture of lateral condyle of right tibia, initial encounter for closed fracture: Secondary | ICD-10-CM | POA: Diagnosis not present

## 2021-03-21 DIAGNOSIS — Z4789 Encounter for other orthopedic aftercare: Secondary | ICD-10-CM | POA: Diagnosis not present

## 2021-03-21 DIAGNOSIS — Z888 Allergy status to other drugs, medicaments and biological substances status: Secondary | ICD-10-CM | POA: Diagnosis not present

## 2021-03-21 DIAGNOSIS — D62 Acute posthemorrhagic anemia: Secondary | ICD-10-CM | POA: Diagnosis not present

## 2021-03-21 DIAGNOSIS — Z791 Long term (current) use of non-steroidal anti-inflammatories (NSAID): Secondary | ICD-10-CM | POA: Diagnosis not present

## 2021-03-21 DIAGNOSIS — F419 Anxiety disorder, unspecified: Secondary | ICD-10-CM | POA: Diagnosis not present

## 2021-03-21 DIAGNOSIS — S82141A Displaced bicondylar fracture of right tibia, initial encounter for closed fracture: Secondary | ICD-10-CM | POA: Diagnosis not present

## 2021-03-21 DIAGNOSIS — Z01812 Encounter for preprocedural laboratory examination: Secondary | ICD-10-CM | POA: Insufficient documentation

## 2021-03-21 DIAGNOSIS — Z820 Family history of epilepsy and other diseases of the nervous system: Secondary | ICD-10-CM | POA: Diagnosis not present

## 2021-03-21 DIAGNOSIS — Z833 Family history of diabetes mellitus: Secondary | ICD-10-CM | POA: Diagnosis not present

## 2021-03-21 DIAGNOSIS — F418 Other specified anxiety disorders: Secondary | ICD-10-CM | POA: Diagnosis not present

## 2021-03-21 NOTE — Progress Notes (Signed)
PCP - Dr Collie Siad Cardiologist - n/a  Chest x-ray - n/a EKG - n/a Stress Test - n/a ECHO - n/a Cardiac Cath - n/a  STOP now taking any Aspirin (unless otherwise instructed by your surgeon), Aleve, Naproxen, Ibuprofen, Motrin, Advil, Goody's, BC's, all herbal medications, fish oil, and all vitamins.   Coronavirus Screening Covid test on 03/21/21 is pending results. Do you have any of the following symptoms:  Cough yes/no: No Fever (>100.28F)  yes/no: No Runny nose yes/no: No Sore throat yes/no: No Difficulty breathing/shortness of breath  yes/no: No  Have you traveled in the last 14 days and where? yes/no: No  Patient verbalized understanding of instructions that were given via phone.

## 2021-03-22 ENCOUNTER — Inpatient Hospital Stay (HOSPITAL_COMMUNITY)
Admission: RE | Admit: 2021-03-22 | Discharge: 2021-03-23 | DRG: 493 | Disposition: A | Discharge status: Home / Self Care | Payer: BC Managed Care – PPO | Attending: Orthopedic Surgery | Admitting: Orthopedic Surgery

## 2021-03-22 ENCOUNTER — Encounter (HOSPITAL_COMMUNITY)
Admission: RE | Disposition: A | Discharge status: Home / Self Care | Payer: Self-pay | Source: Home / Self Care | Attending: Orthopedic Surgery

## 2021-03-22 ENCOUNTER — Inpatient Hospital Stay (HOSPITAL_COMMUNITY): Payer: BC Managed Care – PPO

## 2021-03-22 ENCOUNTER — Inpatient Hospital Stay (HOSPITAL_COMMUNITY): Payer: BC Managed Care – PPO | Admitting: Certified Registered"

## 2021-03-22 ENCOUNTER — Encounter (HOSPITAL_COMMUNITY): Payer: Self-pay | Admitting: Orthopedic Surgery

## 2021-03-22 DIAGNOSIS — Z79899 Other long term (current) drug therapy: Secondary | ICD-10-CM | POA: Diagnosis not present

## 2021-03-22 DIAGNOSIS — Z820 Family history of epilepsy and other diseases of the nervous system: Secondary | ICD-10-CM | POA: Diagnosis not present

## 2021-03-22 DIAGNOSIS — Z888 Allergy status to other drugs, medicaments and biological substances status: Secondary | ICD-10-CM | POA: Diagnosis not present

## 2021-03-22 DIAGNOSIS — E559 Vitamin D deficiency, unspecified: Secondary | ICD-10-CM | POA: Diagnosis present

## 2021-03-22 DIAGNOSIS — S82141A Displaced bicondylar fracture of right tibia, initial encounter for closed fracture: Principal | ICD-10-CM | POA: Diagnosis present

## 2021-03-22 DIAGNOSIS — Z20822 Contact with and (suspected) exposure to covid-19: Secondary | ICD-10-CM | POA: Diagnosis present

## 2021-03-22 DIAGNOSIS — E8889 Other specified metabolic disorders: Secondary | ICD-10-CM | POA: Diagnosis present

## 2021-03-22 DIAGNOSIS — Z419 Encounter for procedure for purposes other than remedying health state, unspecified: Secondary | ICD-10-CM

## 2021-03-22 DIAGNOSIS — Z833 Family history of diabetes mellitus: Secondary | ICD-10-CM | POA: Diagnosis not present

## 2021-03-22 DIAGNOSIS — S82101D Unspecified fracture of upper end of right tibia, subsequent encounter for closed fracture with routine healing: Secondary | ICD-10-CM | POA: Diagnosis not present

## 2021-03-22 DIAGNOSIS — Z531 Procedure and treatment not carried out because of patient's decision for reasons of belief and group pressure: Secondary | ICD-10-CM

## 2021-03-22 DIAGNOSIS — T148XXA Other injury of unspecified body region, initial encounter: Secondary | ICD-10-CM

## 2021-03-22 DIAGNOSIS — D62 Acute posthemorrhagic anemia: Secondary | ICD-10-CM | POA: Diagnosis not present

## 2021-03-22 DIAGNOSIS — Z791 Long term (current) use of non-steroidal anti-inflammatories (NSAID): Secondary | ICD-10-CM

## 2021-03-22 DIAGNOSIS — F419 Anxiety disorder, unspecified: Secondary | ICD-10-CM | POA: Diagnosis present

## 2021-03-22 HISTORY — DX: Other specified health status: Z78.9

## 2021-03-22 HISTORY — DX: Reserved for inherently not codable concepts without codable children: IMO0001

## 2021-03-22 HISTORY — DX: Procedure and treatment not carried out because of patient's decision for reasons of belief and group pressure: Z53.1

## 2021-03-22 HISTORY — DX: Displaced bicondylar fracture of right tibia, initial encounter for closed fracture: S82.141A

## 2021-03-22 HISTORY — PX: ORIF TIBIA PLATEAU: SHX2132

## 2021-03-22 LAB — CBC
HCT: 40.5 % (ref 39.0–52.0)
Hemoglobin: 13 g/dL (ref 13.0–17.0)
MCH: 26.5 pg (ref 26.0–34.0)
MCHC: 32.1 g/dL (ref 30.0–36.0)
MCV: 82.7 fL (ref 80.0–100.0)
Platelets: 351 10*3/uL (ref 150–400)
RBC: 4.9 MIL/uL (ref 4.22–5.81)
RDW: 11.1 % — ABNORMAL LOW (ref 11.5–15.5)
WBC: 7.5 10*3/uL (ref 4.0–10.5)
nRBC: 0 % (ref 0.0–0.2)

## 2021-03-22 LAB — CREATININE, SERUM
Creatinine, Ser: 1.14 mg/dL (ref 0.61–1.24)
GFR, Estimated: >60 mL/min (ref >60)

## 2021-03-22 LAB — SURGICAL PCR SCREEN
MRSA, PCR: NEGATIVE
Staphylococcus aureus: NEGATIVE

## 2021-03-22 LAB — NO BLOOD PRODUCTS

## 2021-03-22 LAB — SARS CORONAVIRUS 2 (TAT 6-24 HRS): SARS Coronavirus 2: NEGATIVE

## 2021-03-22 SURGERY — OPEN REDUCTION INTERNAL FIXATION (ORIF) TIBIAL PLATEAU
Anesthesia: General | Site: Leg Lower | Laterality: Right

## 2021-03-22 MED ORDER — MIDAZOLAM HCL 5 MG/5ML IJ SOLN
INTRAMUSCULAR | Status: DC | PRN
  Administered 2021-03-22: 2 mg via INTRAVENOUS

## 2021-03-22 MED ORDER — METHOCARBAMOL 750 MG PO TABS
750.0000 mg | ORAL_TABLET | Freq: Three times a day (TID) | ORAL | Status: DC
  Administered 2021-03-22 – 2021-03-23 (×3): 750 mg via ORAL
  Filled 2021-03-22 (×5): qty 1

## 2021-03-22 MED ORDER — VITAMIN D 25 MCG (1000 UNIT) PO TABS
2000.0000 [IU] | ORAL_TABLET | Freq: Two times a day (BID) | ORAL | Status: DC
  Administered 2021-03-22 – 2021-03-23 (×2): 2000 [IU] via ORAL
  Filled 2021-03-22 (×2): qty 2

## 2021-03-22 MED ORDER — LACTATED RINGERS IV SOLN: INTRAVENOUS | Status: DC | PRN

## 2021-03-22 MED ORDER — OXYCODONE HCL 5 MG PO TABS
5.0000 mg | ORAL_TABLET | Freq: Once | ORAL | Status: AC | PRN
  Administered 2021-03-22: 5 mg via ORAL

## 2021-03-22 MED ORDER — FENTANYL CITRATE (PF) 100 MCG/2ML IJ SOLN
25.0000 ug | INTRAMUSCULAR | Status: DC | PRN
  Administered 2021-03-22 (×3): 50 ug via INTRAVENOUS

## 2021-03-22 MED ORDER — POVIDONE-IODINE 10 % EX SWAB: 2.0000 "application " | Freq: Once | CUTANEOUS | Status: DC

## 2021-03-22 MED ORDER — CEFAZOLIN SODIUM-DEXTROSE 2-4 GM/100ML-% IV SOLN
2.0000 g | Freq: Four times a day (QID) | INTRAVENOUS | Status: AC
  Administered 2021-03-22 – 2021-03-23 (×3): 2 g via INTRAVENOUS
  Filled 2021-03-22 (×3): qty 100

## 2021-03-22 MED ORDER — CEFAZOLIN SODIUM-DEXTROSE 2-4 GM/100ML-% IV SOLN
2.0000 g | INTRAVENOUS | Status: AC
  Administered 2021-03-22: 2 g via INTRAVENOUS
  Filled 2021-03-22: qty 100

## 2021-03-22 MED ORDER — METHOCARBAMOL 500 MG PO TABS
ORAL_TABLET | ORAL | Status: AC
  Filled 2021-03-22: qty 2

## 2021-03-22 MED ORDER — PROPOFOL 10 MG/ML IV BOLUS
INTRAVENOUS | Status: DC | PRN
  Administered 2021-03-22: 200 mg via INTRAVENOUS

## 2021-03-22 MED ORDER — MIDAZOLAM HCL 2 MG/2ML IJ SOLN
INTRAMUSCULAR | Status: AC
  Filled 2021-03-22: qty 2

## 2021-03-22 MED ORDER — TRANEXAMIC ACID-NACL 1000-0.7 MG/100ML-% IV SOLN
INTRAVENOUS | Status: DC | PRN
  Administered 2021-03-22: 1000 mg via INTRAVENOUS

## 2021-03-22 MED ORDER — FENTANYL CITRATE (PF) 100 MCG/2ML IJ SOLN
INTRAMUSCULAR | Status: AC
  Filled 2021-03-22: qty 2

## 2021-03-22 MED ORDER — SUGAMMADEX SODIUM 200 MG/2ML IV SOLN
INTRAVENOUS | Status: DC | PRN
  Administered 2021-03-22: 200 mg via INTRAVENOUS

## 2021-03-22 MED ORDER — METOCLOPRAMIDE HCL 5 MG/ML IJ SOLN: 5.0000 mg | Freq: Three times a day (TID) | INTRAMUSCULAR | Status: DC | PRN

## 2021-03-22 MED ORDER — POTASSIUM CHLORIDE IN NACL 20-0.9 MEQ/L-% IV SOLN
INTRAVENOUS | Status: DC
  Filled 2021-03-22: qty 1000

## 2021-03-22 MED ORDER — OXYCODONE HCL 5 MG/5ML PO SOLN: 5.0000 mg | Freq: Once | ORAL | Status: AC | PRN

## 2021-03-22 MED ORDER — KETOROLAC TROMETHAMINE 30 MG/ML IJ SOLN
30.0000 mg | Freq: Once | INTRAMUSCULAR | Status: AC
  Administered 2021-03-22: 30 mg via INTRAVENOUS

## 2021-03-22 MED ORDER — HYDROMORPHONE HCL 1 MG/ML IJ SOLN
INTRAMUSCULAR | Status: AC
  Filled 2021-03-22: qty 1

## 2021-03-22 MED ORDER — POLYETHYLENE GLYCOL 3350 17 G PO PACK
17.0000 g | PACK | Freq: Every day | ORAL | Status: DC
  Administered 2021-03-22 – 2021-03-23 (×2): 17 g via ORAL
  Filled 2021-03-22 (×2): qty 1

## 2021-03-22 MED ORDER — LIDOCAINE 2% (20 MG/ML) 5 ML SYRINGE
INTRAMUSCULAR | Status: AC
  Filled 2021-03-22: qty 5

## 2021-03-22 MED ORDER — ONDANSETRON HCL 4 MG/2ML IJ SOLN
INTRAMUSCULAR | Status: AC
  Filled 2021-03-22: qty 2

## 2021-03-22 MED ORDER — KETOROLAC TROMETHAMINE 15 MG/ML IJ SOLN
15.0000 mg | Freq: Four times a day (QID) | INTRAMUSCULAR | Status: DC
  Administered 2021-03-22 – 2021-03-23 (×4): 15 mg via INTRAVENOUS
  Filled 2021-03-22 (×4): qty 1

## 2021-03-22 MED ORDER — OXYCODONE HCL 5 MG PO TABS
10.0000 mg | ORAL_TABLET | ORAL | Status: DC | PRN
  Administered 2021-03-22 – 2021-03-23 (×3): 15 mg via ORAL
  Filled 2021-03-22 (×2): qty 3
  Filled 2021-03-22: qty 2
  Filled 2021-03-22: qty 3

## 2021-03-22 MED ORDER — PROPRANOLOL HCL 40 MG PO TABS
40.0000 mg | ORAL_TABLET | Freq: Every day | ORAL | Status: DC | PRN
  Filled 2021-03-22: qty 1

## 2021-03-22 MED ORDER — ONDANSETRON HCL 4 MG PO TABS
4.0000 mg | ORAL_TABLET | Freq: Four times a day (QID) | ORAL | Status: DC | PRN
Start: 2021-03-22 — End: 2021-03-23

## 2021-03-22 MED ORDER — METHOCARBAMOL 1000 MG/10ML IJ SOLN
500.0000 mg | Freq: Three times a day (TID) | INTRAVENOUS | Status: DC
  Filled 2021-03-22: qty 5

## 2021-03-22 MED ORDER — CHLORHEXIDINE GLUCONATE 4 % EX LIQD: 60.0000 mL | Freq: Once | CUTANEOUS | Status: DC

## 2021-03-22 MED ORDER — ONDANSETRON HCL 4 MG/2ML IJ SOLN
INTRAMUSCULAR | Status: DC | PRN
  Administered 2021-03-22: 4 mg via INTRAVENOUS

## 2021-03-22 MED ORDER — HYDROMORPHONE HCL 1 MG/ML IJ SOLN
0.5000 mg | INTRAMUSCULAR | Status: DC | PRN
  Administered 2021-03-22 – 2021-03-23 (×2): 1 mg via INTRAVENOUS
  Filled 2021-03-22 (×3): qty 1

## 2021-03-22 MED ORDER — ENOXAPARIN SODIUM 40 MG/0.4ML IJ SOSY
40.0000 mg | PREFILLED_SYRINGE | INTRAMUSCULAR | Status: DC
  Administered 2021-03-23: 40 mg via SUBCUTANEOUS
  Filled 2021-03-22: qty 0.4

## 2021-03-22 MED ORDER — ACETAMINOPHEN 10 MG/ML IV SOLN
1000.0000 mg | Freq: Once | INTRAVENOUS | Status: DC | PRN
  Administered 2021-03-22: 1000 mg via INTRAVENOUS

## 2021-03-22 MED ORDER — LIDOCAINE 2% (20 MG/ML) 5 ML SYRINGE
INTRAMUSCULAR | Status: DC | PRN
  Administered 2021-03-22: 100 mg via INTRAVENOUS

## 2021-03-22 MED ORDER — ROCURONIUM BROMIDE 10 MG/ML (PF) SYRINGE
PREFILLED_SYRINGE | INTRAVENOUS | Status: DC | PRN
  Administered 2021-03-22 (×3): 20 mg via INTRAVENOUS
  Administered 2021-03-22: 60 mg via INTRAVENOUS

## 2021-03-22 MED ORDER — DEXAMETHASONE SODIUM PHOSPHATE 10 MG/ML IJ SOLN
INTRAMUSCULAR | Status: AC
  Filled 2021-03-22: qty 1

## 2021-03-22 MED ORDER — KETOROLAC TROMETHAMINE 30 MG/ML IJ SOLN
INTRAMUSCULAR | Status: AC
  Filled 2021-03-22: qty 1

## 2021-03-22 MED ORDER — FENTANYL CITRATE (PF) 250 MCG/5ML IJ SOLN
INTRAMUSCULAR | Status: AC
  Filled 2021-03-22: qty 5

## 2021-03-22 MED ORDER — OXYCODONE HCL 5 MG PO TABS
ORAL_TABLET | ORAL | Status: AC
  Filled 2021-03-22: qty 1

## 2021-03-22 MED ORDER — PROMETHAZINE HCL 25 MG/ML IJ SOLN
6.2500 mg | INTRAMUSCULAR | Status: DC | PRN
Start: 2021-03-22 — End: 2021-03-22

## 2021-03-22 MED ORDER — BISACODYL 10 MG RE SUPP: 10.0000 mg | Freq: Every day | RECTAL | Status: DC | PRN

## 2021-03-22 MED ORDER — ORAL CARE MOUTH RINSE: 15.0000 mL | Freq: Once | OROMUCOSAL | Status: AC

## 2021-03-22 MED ORDER — ACETAMINOPHEN 500 MG PO TABS
1000.0000 mg | ORAL_TABLET | Freq: Three times a day (TID) | ORAL | Status: DC
  Administered 2021-03-22 – 2021-03-23 (×3): 1000 mg via ORAL
  Filled 2021-03-22 (×4): qty 2

## 2021-03-22 MED ORDER — 0.9 % SODIUM CHLORIDE (POUR BTL) OPTIME
TOPICAL | Status: DC | PRN
  Administered 2021-03-22: 1000 mL

## 2021-03-22 MED ORDER — ASCORBIC ACID 500 MG PO TABS
1000.0000 mg | ORAL_TABLET | Freq: Every day | ORAL | Status: DC
  Administered 2021-03-22 – 2021-03-23 (×2): 1000 mg via ORAL
  Filled 2021-03-22 (×2): qty 2

## 2021-03-22 MED ORDER — ACETAMINOPHEN 10 MG/ML IV SOLN
INTRAVENOUS | Status: AC
  Filled 2021-03-22: qty 100

## 2021-03-22 MED ORDER — METOCLOPRAMIDE HCL 5 MG PO TABS: 5.0000 mg | ORAL_TABLET | Freq: Three times a day (TID) | ORAL | Status: DC | PRN

## 2021-03-22 MED ORDER — MIDAZOLAM HCL 5 MG/5ML IJ SOLN: INTRAMUSCULAR | Status: DC | PRN

## 2021-03-22 MED ORDER — LACTATED RINGERS IV SOLN: INTRAVENOUS | Status: DC

## 2021-03-22 MED ORDER — HYDROMORPHONE HCL 1 MG/ML IJ SOLN
0.5000 mg | INTRAMUSCULAR | Status: DC | PRN
Start: 2021-03-22 — End: 2021-03-22
  Administered 2021-03-22 (×3): 0.5 mg via INTRAVENOUS

## 2021-03-22 MED ORDER — ONDANSETRON HCL 4 MG/2ML IJ SOLN
4.0000 mg | Freq: Four times a day (QID) | INTRAMUSCULAR | Status: DC | PRN
  Administered 2021-03-22: 4 mg via INTRAVENOUS
  Filled 2021-03-22: qty 2

## 2021-03-22 MED ORDER — TRANEXAMIC ACID-NACL 1000-0.7 MG/100ML-% IV SOLN
1000.0000 mg | INTRAVENOUS | Status: DC
  Filled 2021-03-22: qty 100

## 2021-03-22 MED ORDER — DOCUSATE SODIUM 100 MG PO CAPS
100.0000 mg | ORAL_CAPSULE | Freq: Two times a day (BID) | ORAL | Status: DC
  Administered 2021-03-22 – 2021-03-23 (×2): 100 mg via ORAL
  Filled 2021-03-22 (×2): qty 1

## 2021-03-22 MED ORDER — CHLORHEXIDINE GLUCONATE 0.12 % MT SOLN
15.0000 mL | Freq: Once | OROMUCOSAL | Status: AC
  Administered 2021-03-22: 15 mL via OROMUCOSAL
  Filled 2021-03-22: qty 15

## 2021-03-22 MED ORDER — DEXAMETHASONE SODIUM PHOSPHATE 10 MG/ML IJ SOLN
INTRAMUSCULAR | Status: DC | PRN
  Administered 2021-03-22: 10 mg via INTRAVENOUS

## 2021-03-22 MED ORDER — OXYCODONE HCL 5 MG PO TABS: 5.0000 mg | ORAL_TABLET | ORAL | Status: DC | PRN

## 2021-03-22 MED ORDER — ACETAMINOPHEN 325 MG PO TABS: 325.0000 mg | ORAL_TABLET | Freq: Four times a day (QID) | ORAL | Status: DC | PRN

## 2021-03-22 MED ORDER — FENTANYL CITRATE (PF) 250 MCG/5ML IJ SOLN
INTRAMUSCULAR | Status: DC | PRN
  Administered 2021-03-22 (×2): 50 ug via INTRAVENOUS
  Administered 2021-03-22: 100 ug via INTRAVENOUS
  Administered 2021-03-22: 50 ug via INTRAVENOUS

## 2021-03-22 SURGICAL SUPPLY — 89 items
BANDAGE ESMARK 6X9 LF (GAUZE/BANDAGES/DRESSINGS) ×1 IMPLANT
BIT DRILL 2.5MM SMALL QC EVOS (BIT) ×1 IMPLANT
BIT DRILL OS SMALL 2.0 LONG (BIT) ×1 IMPLANT
BIT DRILL QC 2.5MM SHRT EVO SM (DRILL) ×1 IMPLANT
BLADE CLIPPER SURG (BLADE) IMPLANT
BLADE SURG 10 STRL SS (BLADE) ×2 IMPLANT
BLADE SURG 15 STRL LF DISP TIS (BLADE) ×1 IMPLANT
BLADE SURG 15 STRL SS (BLADE) ×1
BNDG COHESIVE 4X5 TAN STRL (GAUZE/BANDAGES/DRESSINGS) ×2 IMPLANT
BNDG ELASTIC 4X5.8 VLCR NS LF (GAUZE/BANDAGES/DRESSINGS) ×2 IMPLANT
BNDG ELASTIC 4X5.8 VLCR STR LF (GAUZE/BANDAGES/DRESSINGS) ×2 IMPLANT
BNDG ELASTIC 6X5.8 VLCR STR LF (GAUZE/BANDAGES/DRESSINGS) ×2 IMPLANT
BNDG ESMARK 6X9 LF (GAUZE/BANDAGES/DRESSINGS) ×2
BNDG GAUZE ELAST 4 BULKY (GAUZE/BANDAGES/DRESSINGS) ×2 IMPLANT
BONE CANC CHIPS 40CC CAN1/2 (Bone Implant) ×2 IMPLANT
BRUSH SCRUB EZ PLAIN DRY (MISCELLANEOUS) ×4 IMPLANT
CANISTER SUCT 3000ML PPV (MISCELLANEOUS) ×2 IMPLANT
CHIPS CANC BONE 40CC CAN1/2 (Bone Implant) ×1 IMPLANT
COVER SURGICAL LIGHT HANDLE (MISCELLANEOUS) ×2 IMPLANT
COVER WAND RF STERILE (DRAPES) ×2 IMPLANT
CUFF TOURN SGL QUICK 34 (TOURNIQUET CUFF) ×1
CUFF TRNQT CYL 34X4.125X (TOURNIQUET CUFF) ×1 IMPLANT
DRAPE C-ARM 42X72 X-RAY (DRAPES) ×2 IMPLANT
DRAPE C-ARMOR (DRAPES) ×2 IMPLANT
DRAPE HALF SHEET 40X57 (DRAPES) IMPLANT
DRAPE INCISE IOBAN 66X45 STRL (DRAPES) ×2 IMPLANT
DRAPE U-SHAPE 47X51 STRL (DRAPES) ×2 IMPLANT
DRILL 2.5MM SMALL QC EVOS (BIT) ×2
DRILL OS SMALL 2.0 LONG (BIT) ×2
DRILL QC 2.5MM SHORT EVOS SM (DRILL) ×2
DRSG ADAPTIC 3X8 NADH LF (GAUZE/BANDAGES/DRESSINGS) ×2 IMPLANT
DRSG PAD ABDOMINAL 8X10 ST (GAUZE/BANDAGES/DRESSINGS) ×4 IMPLANT
ELECT REM PT RETURN 9FT ADLT (ELECTROSURGICAL) ×2
ELECTRODE REM PT RTRN 9FT ADLT (ELECTROSURGICAL) ×1 IMPLANT
GAUZE SPONGE 4X4 12PLY STRL (GAUZE/BANDAGES/DRESSINGS) ×2 IMPLANT
GLOVE BIO SURGEON STRL SZ7.5 (GLOVE) ×2 IMPLANT
GLOVE BIO SURGEON STRL SZ8 (GLOVE) ×2 IMPLANT
GLOVE BIOGEL PI IND STRL 7.5 (GLOVE) ×1 IMPLANT
GLOVE BIOGEL PI INDICATOR 7.5 (GLOVE) ×1
GLOVE SRG 8 PF TXTR STRL LF DI (GLOVE) ×1 IMPLANT
GLOVE SURG UNDER POLY LF SZ8 (GLOVE) ×1
GOWN STRL REUS W/ TWL LRG LVL3 (GOWN DISPOSABLE) ×2 IMPLANT
GOWN STRL REUS W/ TWL XL LVL3 (GOWN DISPOSABLE) ×1 IMPLANT
GOWN STRL REUS W/TWL LRG LVL3 (GOWN DISPOSABLE) ×2
GOWN STRL REUS W/TWL XL LVL3 (GOWN DISPOSABLE) ×1
HEMOSTAT ARISTA ABSORB 3G PWDR (HEMOSTASIS) ×2 IMPLANT
IMMOBILIZER KNEE 22 UNIV (SOFTGOODS) ×2 IMPLANT
K-WIRE 1.6 (WIRE) ×2
K-WIRE FX150X1.6XTROC PNT (WIRE) ×2
KIT BASIN OR (CUSTOM PROCEDURE TRAY) ×2 IMPLANT
KIT TURNOVER KIT B (KITS) ×2 IMPLANT
KWIRE FX150X1.6XTROC PNT (WIRE) ×2 IMPLANT
NDL SUT 6 .5 CRC .975X.05 MAYO (NEEDLE) IMPLANT
NEEDLE MAYO TAPER (NEEDLE)
NS IRRIG 1000ML POUR BTL (IV SOLUTION) ×2 IMPLANT
PACK ORTHO EXTREMITY (CUSTOM PROCEDURE TRAY) ×2 IMPLANT
PAD ARMBOARD 7.5X6 YLW CONV (MISCELLANEOUS) ×4 IMPLANT
PAD CAST 4YDX4 CTTN HI CHSV (CAST SUPPLIES) ×1 IMPLANT
PADDING CAST COTTON 4X4 STRL (CAST SUPPLIES) ×1
PADDING CAST COTTON 6X4 STRL (CAST SUPPLIES) ×2 IMPLANT
PLATE TIB PART EVOS 2.7X74 3H (Plate) ×2 IMPLANT
SCREW CORT ST EVOS 3.5X55 (Screw) ×2 IMPLANT
SCREW CORT ST EVOS 3.5X60 (Screw) ×2 IMPLANT
SCREW CORT ST EVOS 3.5X80 (Screw) ×2 IMPLANT
SCREW CORT VA EVOS 2.7X65 (Screw) ×2 IMPLANT
SCREW CORT VA EVOS 2.7X70 (Screw) ×2 IMPLANT
SCREW CTX 3.5X48MM EVOS (Screw) ×2 IMPLANT
SCREW LOCK ST EVOS 2.7X65 (Screw) ×2 IMPLANT
SCREW LOCK ST EVOS 2.7X70 (Screw) ×2 IMPLANT
SCREW LOCK ST EVOS 2.7X75 (Screw) ×2 IMPLANT
SPONGE LAP 18X18 RF (DISPOSABLE) ×2 IMPLANT
STAPLER VISISTAT 35W (STAPLE) ×2 IMPLANT
STOCKINETTE IMPERVIOUS LG (DRAPES) ×2 IMPLANT
SUCTION FRAZIER HANDLE 10FR (MISCELLANEOUS) ×1
SUCTION TUBE FRAZIER 10FR DISP (MISCELLANEOUS) ×1 IMPLANT
SUT ETHILON 3 0 PS 1 (SUTURE) IMPLANT
SUT PROLENE 0 CT 2 (SUTURE) ×4 IMPLANT
SUT VIC AB 0 CT1 27 (SUTURE) ×1
SUT VIC AB 0 CT1 27XBRD ANBCTR (SUTURE) ×1 IMPLANT
SUT VIC AB 1 CT1 27 (SUTURE) ×1
SUT VIC AB 1 CT1 27XBRD ANBCTR (SUTURE) ×1 IMPLANT
SUT VIC AB 2-0 CT1 27 (SUTURE) ×2
SUT VIC AB 2-0 CT1 TAPERPNT 27 (SUTURE) ×2 IMPLANT
TOWEL GREEN STERILE (TOWEL DISPOSABLE) ×4 IMPLANT
TOWEL GREEN STERILE FF (TOWEL DISPOSABLE) ×2 IMPLANT
TRAY FOLEY MTR SLVR 16FR STAT (SET/KITS/TRAYS/PACK) IMPLANT
TUBE CONNECTING 12X1/4 (SUCTIONS) ×2 IMPLANT
WATER STERILE IRR 1000ML POUR (IV SOLUTION) ×4 IMPLANT
YANKAUER SUCT BULB TIP NO VENT (SUCTIONS) ×2 IMPLANT
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
## 2021-03-22 NOTE — Progress Notes (Signed)
Patient admitted to room. Alert and oriented x4. Skin dry and warm to touch. No pain at this time. Respiration even and none labor.
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
## 2021-03-22 NOTE — Anesthesia Postprocedure Evaluation (Signed)
Anesthesia Post Note  Patient: Jonathan Garcia  Procedure(s) Performed: OPEN REDUCTION INTERNAL FIXATION (ORIF) RIGHT TIBIAL PLATEAU (Right Leg Lower)     Patient location during evaluation: PACU Anesthesia Type: General Level of consciousness: awake and alert Pain management: pain level controlled Vital Signs Assessment: post-procedure vital signs reviewed and stable Respiratory status: spontaneous breathing, nonlabored ventilation, respiratory function stable and patient connected to nasal cannula oxygen Cardiovascular status: blood pressure returned to baseline and stable Postop Assessment: no apparent nausea or vomiting Anesthetic complications: no   No complications documented.  Last Vitals:  Vitals:   03/22/21 1545 03/22/21 1600  BP: (!) 133/59 (!) 146/78  Pulse: 69 68  Resp: 18 (!) 22  Temp: 36.6 C   SpO2: 95% 94%    Last Pain:  Vitals:   03/22/21 1600  TempSrc:   PainSc: 6                  Corneshia Hines COKER

## 2021-03-22 NOTE — Transfer of Care (Signed)
Immediate Anesthesia Transfer of Care Note  Patient: Jonathan Garcia  Procedure(s) Performed: OPEN REDUCTION INTERNAL FIXATION (ORIF) RIGHT TIBIAL PLATEAU (Right Leg Lower)  Patient Location: PACU  Anesthesia Type:General  Level of Consciousness: awake, alert , oriented and patient cooperative  Airway & Oxygen Therapy: Patient Spontanous Breathing and Patient connected to nasal cannula oxygen  Post-op Assessment: Report given to RN, Post -op Vital signs reviewed and stable and Patient moving all extremities X 4  Post vital signs: Reviewed and stable  Last Vitals:  Vitals Value Taken Time  BP    Temp    Pulse    Resp 17 03/22/21 1359  SpO2    Vitals shown include unvalidated device data.  Last Pain:  Vitals:   03/22/21 0721  TempSrc:   PainSc: 5       Patients Stated Pain Goal: 3 (03/22/21 0721)  Complications: No complications documented.

## 2021-03-22 NOTE — Anesthesia Procedure Notes (Signed)
Procedure Name: Intubation Date/Time: 03/22/2021 11:38 AM Performed by: Lanell Matar, CRNA Pre-anesthesia Checklist: Patient identified, Emergency Drugs available, Suction available and Patient being monitored Patient Re-evaluated:Patient Re-evaluated prior to induction Oxygen Delivery Method: Circle System Utilized Preoxygenation: Pre-oxygenation with 100% oxygen Induction Type: IV induction Ventilation: Mask ventilation without difficulty and Oral airway inserted - appropriate to patient size Laryngoscope Size: Hyacinth Meeker and 2 Grade View: Grade I Tube type: Oral Tube size: 7.5 mm Number of attempts: 1 Airway Equipment and Method: Stylet and Oral airway Placement Confirmation: ETT inserted through vocal cords under direct vision,  positive ETCO2 and breath sounds checked- equal and bilateral Secured at: 22 cm Tube secured with: Tape Dental Injury: Teeth and Oropharynx as per pre-operative assessment

## 2021-03-22 NOTE — Progress Notes (Signed)
Orthopedic Tech Progress Note Patient Details:  Jonathan Garcia 20-Sep-1986 [id number] Called in order to [facility] for a UNLOCKED ROM KNEE BRACE Patient ID: [name], male   DOB: 10-25-1986, 35 y.o.   MRN: [id number]   Donald Pore 03/22/2021, 5:24 PM

## 2021-03-22 NOTE — Plan of Care (Signed)
  Problem: Activity: Goal: Risk for activity intolerance will decrease Outcome: Progressing   Problem: Pain Managment: Goal: General experience of comfort will improve Outcome: Progressing   Problem: Safety: Goal: Ability to remain free from injury will improve Outcome: Progressing   

## 2021-03-23 ENCOUNTER — Other Ambulatory Visit (HOSPITAL_COMMUNITY): Payer: Self-pay

## 2021-03-23 ENCOUNTER — Other Ambulatory Visit: Payer: BC Managed Care – PPO

## 2021-03-23 ENCOUNTER — Encounter (HOSPITAL_COMMUNITY): Payer: Self-pay | Admitting: Orthopedic Surgery

## 2021-03-23 DIAGNOSIS — E559 Vitamin D deficiency, unspecified: Secondary | ICD-10-CM | POA: Diagnosis present

## 2021-03-23 DIAGNOSIS — F419 Anxiety disorder, unspecified: Secondary | ICD-10-CM | POA: Diagnosis present

## 2021-03-23 LAB — CBC
HCT: 36.9 % — ABNORMAL LOW (ref 39.0–52.0)
Hemoglobin: 11.6 g/dL — ABNORMAL LOW (ref 13.0–17.0)
MCH: 26.2 pg (ref 26.0–34.0)
MCHC: 31.4 g/dL (ref 30.0–36.0)
MCV: 83.3 fL (ref 80.0–100.0)
Platelets: 399 10*3/uL (ref 150–400)
RBC: 4.43 MIL/uL (ref 4.22–5.81)
RDW: 11.1 % — ABNORMAL LOW (ref 11.5–15.5)
WBC: 9.6 10*3/uL (ref 4.0–10.5)
nRBC: 0 % (ref 0.0–0.2)

## 2021-03-23 LAB — VITAMIN D 25 HYDROXY (VIT D DEFICIENCY, FRACTURES): Vit D, 25-Hydroxy: 18.81 ng/mL — ABNORMAL LOW (ref 30–100)

## 2021-03-23 MED ORDER — SHARPS CONTAINER MISC
1.0000 | 0 refills | Status: DC | PRN
  Filled 2021-03-23: qty 1, fill #0

## 2021-03-23 MED ORDER — METHOCARBAMOL 500 MG PO TABS
500.0000 mg | ORAL_TABLET | Freq: Three times a day (TID) | ORAL | 0 refills | Status: DC | PRN
  Filled 2021-03-23: qty 60, 10d supply, fill #0

## 2021-03-23 MED ORDER — ENOXAPARIN SODIUM 40 MG/0.4ML IJ SOSY
40.0000 mg | PREFILLED_SYRINGE | INTRAMUSCULAR | 0 refills | Status: DC
  Filled 2021-03-23: qty 8.4, 21d supply, fill #0

## 2021-03-23 MED ORDER — ASCORBIC ACID 1000 MG PO TABS
1000.0000 mg | ORAL_TABLET | Freq: Every day | ORAL | 0 refills | Status: AC
  Filled 2021-03-23: qty 60, 60d supply, fill #0

## 2021-03-23 MED ORDER — KETOROLAC TROMETHAMINE 10 MG PO TABS
10.0000 mg | ORAL_TABLET | Freq: Four times a day (QID) | ORAL | 0 refills | Status: AC | PRN
  Filled 2021-03-23: qty 12, 3d supply, fill #0

## 2021-03-23 MED ORDER — ENOXAPARIN (LOVENOX) PATIENT EDUCATION KIT
PACK | Freq: Once | Status: AC
  Filled 2021-03-23: qty 1

## 2021-03-23 MED ORDER — CHOLECALCIFEROL 125 MCG (5000 UT) PO TABS
5000.0000 [IU] | ORAL_TABLET | Freq: Every day | ORAL | 6 refills | Status: AC
  Filled 2021-03-23: qty 30, 30d supply, fill #0

## 2021-03-23 MED ORDER — DOCUSATE SODIUM 100 MG PO CAPS
100.0000 mg | ORAL_CAPSULE | Freq: Two times a day (BID) | ORAL | 0 refills | Status: AC
  Filled 2021-03-23: qty 20, 10d supply, fill #0

## 2021-03-23 MED ORDER — ACETAMINOPHEN 500 MG PO TABS
500.0000 mg | ORAL_TABLET | Freq: Two times a day (BID) | ORAL | 0 refills | Status: AC
  Filled 2021-03-23: qty 30, 15d supply, fill #0

## 2021-03-23 MED ORDER — OXYCODONE-ACETAMINOPHEN 5-325 MG PO TABS
1.0000 | ORAL_TABLET | Freq: Four times a day (QID) | ORAL | 0 refills | Status: DC | PRN
  Filled 2021-03-23: qty 50, 7d supply, fill #0

## 2021-03-23 MED ORDER — VITAMIN D (ERGOCALCIFEROL) 1.25 MG (50000 UNIT) PO CAPS
50000.0000 [IU] | ORAL_CAPSULE | ORAL | 0 refills | Status: AC
  Filled 2021-03-23: qty 6, 30d supply, fill #0
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
## 2021-03-23 NOTE — TOC Transition Note (Signed)
Transition of Care [facility]) - CM/SW Discharge Note   Patient Details  Name: Jonathan Garcia MRN: [id number] Date of Birth: 1986-01-11  Transition of Care [facility]) CM/SW Contact:  Durenda Guthrie, RN Phone Number: 03/23/2021, 12:48 PM   Clinical Narrative:   Case manager has requested 3in1 for patient for home. To be delivered to patient's room prior to discharge. No further needs identified.    Final next level of care: Home/Self Care Barriers to Discharge: No Barriers Identified   Patient Goals and CMS Choice        Discharge Placement                       Discharge Plan and Services                DME Arranged: 3-N-1 DME Agency: [employer] Date DME Agency Contacted: 03/23/21 Time DME Agency Contacted: 1247 Representative spoke with at DME Agency: [facility] Arranged: NA          Social Determinants of Health (SDOH) Interventions     Readmission Risk Interventions No flowsheet data found.

## 2021-03-24 ENCOUNTER — Encounter (HOSPITAL_COMMUNITY): Payer: Self-pay | Admitting: Orthopedic Surgery

## 2021-03-25 ENCOUNTER — Encounter (HOSPITAL_COMMUNITY): Payer: Self-pay | Admitting: Orthopedic Surgery

## 2021-03-25 ENCOUNTER — Telehealth (HOSPITAL_COMMUNITY): Payer: Self-pay | Admitting: Pharmacist

## 2021-03-25 NOTE — Telephone Encounter (Signed)
Tolerating well. Discussed rotating injection site and asked if any questions

## 2021-03-30 DIAGNOSIS — S82141A Displaced bicondylar fracture of right tibia, initial encounter for closed fracture: Secondary | ICD-10-CM | POA: Diagnosis not present

## 2021-03-30 DIAGNOSIS — E559 Vitamin D deficiency, unspecified: Secondary | ICD-10-CM | POA: Diagnosis not present

## 2021-04-11 DIAGNOSIS — S82121D Displaced fracture of lateral condyle of right tibia, subsequent encounter for closed fracture with routine healing: Secondary | ICD-10-CM | POA: Diagnosis not present

## 2021-04-11 DIAGNOSIS — S82141D Displaced bicondylar fracture of right tibia, subsequent encounter for closed fracture with routine healing: Secondary | ICD-10-CM | POA: Diagnosis not present
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
## 2021-04-21 DIAGNOSIS — Z1159 Encounter for screening for other viral diseases: Secondary | ICD-10-CM | POA: Diagnosis not present

## 2021-04-21 DIAGNOSIS — Z20828 Contact with and (suspected) exposure to other viral communicable diseases: Secondary | ICD-10-CM | POA: Diagnosis not present

## 2021-04-25 DIAGNOSIS — R262 Difficulty in walking, not elsewhere classified: Secondary | ICD-10-CM | POA: Diagnosis not present

## 2021-04-25 DIAGNOSIS — M79661 Pain in right lower leg: Secondary | ICD-10-CM | POA: Diagnosis not present

## 2021-05-02 DIAGNOSIS — M79661 Pain in right lower leg: Secondary | ICD-10-CM | POA: Diagnosis not present

## 2021-05-02 DIAGNOSIS — R262 Difficulty in walking, not elsewhere classified: Secondary | ICD-10-CM | POA: Diagnosis not present

## 2021-05-09 DIAGNOSIS — M79661 Pain in right lower leg: Secondary | ICD-10-CM | POA: Diagnosis not present

## 2021-05-09 DIAGNOSIS — R262 Difficulty in walking, not elsewhere classified: Secondary | ICD-10-CM | POA: Diagnosis not present

## 2021-05-11 DIAGNOSIS — S82141D Displaced bicondylar fracture of right tibia, subsequent encounter for closed fracture with routine healing: Secondary | ICD-10-CM | POA: Diagnosis not present

## 2021-05-11 DIAGNOSIS — S82121D Displaced fracture of lateral condyle of right tibia, subsequent encounter for closed fracture with routine healing: Secondary | ICD-10-CM | POA: Diagnosis not present

## 2021-05-12 DIAGNOSIS — R262 Difficulty in walking, not elsewhere classified: Secondary | ICD-10-CM | POA: Diagnosis not present

## 2021-05-12 DIAGNOSIS — M79661 Pain in right lower leg: Secondary | ICD-10-CM | POA: Diagnosis not present

## 2021-05-16 DIAGNOSIS — Z20828 Contact with and (suspected) exposure to other viral communicable diseases: Secondary | ICD-10-CM | POA: Diagnosis not present

## 2021-05-16 DIAGNOSIS — Z1159 Encounter for screening for other viral diseases: Secondary | ICD-10-CM | POA: Diagnosis not present

## 2021-05-17 DIAGNOSIS — M79661 Pain in right lower leg: Secondary | ICD-10-CM | POA: Diagnosis not present

## 2021-05-17 DIAGNOSIS — R262 Difficulty in walking, not elsewhere classified: Secondary | ICD-10-CM | POA: Diagnosis not present

## 2021-05-18 DIAGNOSIS — M79661 Pain in right lower leg: Secondary | ICD-10-CM | POA: Diagnosis not present

## 2021-05-18 DIAGNOSIS — R262 Difficulty in walking, not elsewhere classified: Secondary | ICD-10-CM | POA: Diagnosis not present

## 2021-05-19 DIAGNOSIS — Z1159 Encounter for screening for other viral diseases: Secondary | ICD-10-CM | POA: Diagnosis not present

## 2021-05-19 DIAGNOSIS — Z20828 Contact with and (suspected) exposure to other viral communicable diseases: Secondary | ICD-10-CM | POA: Diagnosis not present

## 2021-05-30 DIAGNOSIS — M79661 Pain in right lower leg: Secondary | ICD-10-CM | POA: Diagnosis not present

## 2021-05-30 DIAGNOSIS — Z1159 Encounter for screening for other viral diseases: Secondary | ICD-10-CM | POA: Diagnosis not present

## 2021-05-30 DIAGNOSIS — Z20828 Contact with and (suspected) exposure to other viral communicable diseases: Secondary | ICD-10-CM | POA: Diagnosis not present

## 2021-05-30 DIAGNOSIS — R262 Difficulty in walking, not elsewhere classified: Secondary | ICD-10-CM | POA: Diagnosis not present

## 2021-06-01 DIAGNOSIS — R262 Difficulty in walking, not elsewhere classified: Secondary | ICD-10-CM | POA: Diagnosis not present

## 2021-06-01 DIAGNOSIS — M79661 Pain in right lower leg: Secondary | ICD-10-CM | POA: Diagnosis not present

## 2021-06-06 DIAGNOSIS — M79661 Pain in right lower leg: Secondary | ICD-10-CM | POA: Diagnosis not present

## 2021-06-06 DIAGNOSIS — R262 Difficulty in walking, not elsewhere classified: Secondary | ICD-10-CM | POA: Diagnosis not present

## 2021-06-08 DIAGNOSIS — S82121D Displaced fracture of lateral condyle of right tibia, subsequent encounter for closed fracture with routine healing: Secondary | ICD-10-CM | POA: Diagnosis not present

## 2021-06-08 DIAGNOSIS — S82141D Displaced bicondylar fracture of right tibia, subsequent encounter for closed fracture with routine healing: Secondary | ICD-10-CM | POA: Diagnosis not present

## 2021-06-10 DIAGNOSIS — M79661 Pain in right lower leg: Secondary | ICD-10-CM | POA: Diagnosis not present

## 2021-06-10 DIAGNOSIS — R262 Difficulty in walking, not elsewhere classified: Secondary | ICD-10-CM | POA: Diagnosis not present

## 2021-06-13 DIAGNOSIS — R262 Difficulty in walking, not elsewhere classified: Secondary | ICD-10-CM | POA: Diagnosis not present

## 2021-06-13 DIAGNOSIS — M79661 Pain in right lower leg: Secondary | ICD-10-CM | POA: Diagnosis not present

## 2021-06-17 DIAGNOSIS — M79661 Pain in right lower leg: Secondary | ICD-10-CM | POA: Diagnosis not present

## 2021-06-17 DIAGNOSIS — R262 Difficulty in walking, not elsewhere classified: Secondary | ICD-10-CM | POA: Diagnosis not present

## 2021-06-20 DIAGNOSIS — M79661 Pain in right lower leg: Secondary | ICD-10-CM | POA: Diagnosis not present

## 2021-06-20 DIAGNOSIS — R262 Difficulty in walking, not elsewhere classified: Secondary | ICD-10-CM | POA: Diagnosis not present

## 2021-06-27 DIAGNOSIS — R262 Difficulty in walking, not elsewhere classified: Secondary | ICD-10-CM | POA: Diagnosis not present

## 2021-06-27 DIAGNOSIS — M79661 Pain in right lower leg: Secondary | ICD-10-CM | POA: Diagnosis not present

## 2021-07-01 DIAGNOSIS — M79661 Pain in right lower leg: Secondary | ICD-10-CM | POA: Diagnosis not present

## 2021-07-01 DIAGNOSIS — R262 Difficulty in walking, not elsewhere classified: Secondary | ICD-10-CM | POA: Diagnosis not present

## 2021-07-08 DIAGNOSIS — M79661 Pain in right lower leg: Secondary | ICD-10-CM | POA: Diagnosis not present

## 2021-07-08 DIAGNOSIS — R262 Difficulty in walking, not elsewhere classified: Secondary | ICD-10-CM | POA: Diagnosis not present

## 2021-07-20 DIAGNOSIS — S82121D Displaced fracture of lateral condyle of right tibia, subsequent encounter for closed fracture with routine healing: Secondary | ICD-10-CM | POA: Diagnosis not present

## 2021-07-20 DIAGNOSIS — S82141D Displaced bicondylar fracture of right tibia, subsequent encounter for closed fracture with routine healing: Secondary | ICD-10-CM | POA: Diagnosis not present

## 2021-09-21 DIAGNOSIS — S82121D Displaced fracture of lateral condyle of right tibia, subsequent encounter for closed fracture with routine healing: Secondary | ICD-10-CM | POA: Diagnosis not present

## 2021-09-21 DIAGNOSIS — T8484XA Pain due to internal orthopedic prosthetic devices, implants and grafts, initial encounter: Secondary | ICD-10-CM | POA: Diagnosis not present

## 2021-09-21 DIAGNOSIS — S82141D Displaced bicondylar fracture of right tibia, subsequent encounter for closed fracture with routine healing: Secondary | ICD-10-CM | POA: Diagnosis not present

## 2021-11-24 DIAGNOSIS — Z13220 Encounter for screening for lipoid disorders: Secondary | ICD-10-CM | POA: Diagnosis not present

## 2021-11-24 DIAGNOSIS — Z23 Encounter for immunization: Secondary | ICD-10-CM | POA: Diagnosis not present

## 2021-11-24 DIAGNOSIS — Z0000 Encounter for general adult medical examination without abnormal findings: Secondary | ICD-10-CM | POA: Diagnosis not present

## 2021-11-29 ENCOUNTER — Other Ambulatory Visit: Payer: Self-pay

## 2021-11-29 ENCOUNTER — Ambulatory Visit: Payer: Self-pay

## 2021-11-29 ENCOUNTER — Encounter: Payer: Self-pay | Admitting: Orthopaedic Surgery

## 2021-11-29 ENCOUNTER — Ambulatory Visit: Payer: BC Managed Care – PPO | Admitting: Orthopaedic Surgery

## 2021-11-29 DIAGNOSIS — G8929 Other chronic pain: Secondary | ICD-10-CM | POA: Diagnosis not present

## 2021-11-29 DIAGNOSIS — M25561 Pain in right knee: Secondary | ICD-10-CM

## 2021-11-29 DIAGNOSIS — Z969 Presence of functional implant, unspecified: Secondary | ICD-10-CM | POA: Diagnosis not present
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
## 2021-12-09 ENCOUNTER — Other Ambulatory Visit: Payer: Self-pay | Admitting: Physician Assistant

## 2021-12-09 MED ORDER — ONDANSETRON HCL 4 MG PO TABS: 4.0000 mg | ORAL_TABLET | Freq: Three times a day (TID) | ORAL | 0 refills | Status: DC | PRN

## 2021-12-09 MED ORDER — HYDROCODONE-ACETAMINOPHEN 5-325 MG PO TABS: 1.0000 | ORAL_TABLET | Freq: Three times a day (TID) | ORAL | 0 refills | Status: DC | PRN

## 2021-12-15 ENCOUNTER — Encounter: Payer: Self-pay | Admitting: Orthopaedic Surgery

## 2021-12-15 DIAGNOSIS — T8484XA Pain due to internal orthopedic prosthetic devices, implants and grafts, initial encounter: Secondary | ICD-10-CM | POA: Diagnosis not present

## 2021-12-15 DIAGNOSIS — M25561 Pain in right knee: Secondary | ICD-10-CM | POA: Diagnosis not present

## 2021-12-23 ENCOUNTER — Encounter: Payer: Self-pay | Admitting: Orthopaedic Surgery

## 2021-12-23 ENCOUNTER — Other Ambulatory Visit: Payer: Self-pay

## 2021-12-23 ENCOUNTER — Ambulatory Visit (INDEPENDENT_AMBULATORY_CARE_PROVIDER_SITE_OTHER): Payer: BC Managed Care – PPO | Admitting: Orthopaedic Surgery

## 2021-12-23 VITALS — Ht 68.0 in | Wt 225.0 lb

## 2021-12-23 DIAGNOSIS — Z969 Presence of functional implant, unspecified: Secondary | ICD-10-CM

## 2021-12-23 NOTE — Progress Notes (Signed)
° °  Post-Op Visit Note   Patient: Jonathan Garcia           Date of Birth: 25-Apr-1986           MRN: [id number] Visit Date: 12/23/2021 PCP: Doristine Bosworth, MD   Assessment & Plan:  Chief Complaint:  Chief Complaint  Patient presents with   Right Knee - Follow-up    Hardware removal 12/15/2021   Visit Diagnoses:  1. Retained orthopedic hardware     Plan: Patient is 8 days status post hardware removal from his right tibial plateau.  He is doing well overall.  No complaints.  Examination of the right knee shows healed surgical incision.  No signs of infection.  He has some swelling around the operative site.   Sutures removed Steri-Strips applied.  He is limited to no jumping or running for 6 weeks but otherwise he can do low or nonimpact activities as tolerated.  Recheck in 3 weeks.  Follow-Up Instructions: Return in about 3 weeks (around 01/13/2022).   Orders:  No orders of the defined types were placed in this encounter.  No orders of the defined types were placed in this encounter.   Imaging: No results found.  PMFS History: Patient Active Problem List   Diagnosis Date Noted   Painful orthopaedic hardware (HCC) 12/15/2021   Vitamin D deficiency 03/23/2021   Anxiety    Blood transfusion declined because patient is Jehovah's Witness 03/22/2021   Closed fracture of right tibial plateau 03/22/2021   Social anxiety disorder 10/28/2018   Penile lesion 10/28/2018   Past Medical History:  Diagnosis Date   Anxiety    Blood transfusion declined because patient is Jehovah's Witness 03/22/2021   Closed fracture of right tibial plateau 03/22/2021   Patient is Jehovah's Witness     Family History  Problem Relation Age of Onset   Diabetes Father    Parkinson's disease Paternal Grandmother    Diabetes Paternal Grandmother     Past Surgical History:  Procedure Laterality Date   ORIF TIBIA PLATEAU Right 03/22/2021   Procedure: OPEN REDUCTION INTERNAL FIXATION (ORIF) RIGHT  TIBIAL PLATEAU;  Surgeon: Myrene Galas, MD;  Location: [facility] OR;  Service: Orthopedics;  Laterality: Right;   Social History   Occupational History   Not on file  Tobacco Use   Smoking status: Never   Smokeless tobacco: Never  Vaping Use   Vaping Use: Never used  Substance and Sexual Activity   Alcohol use: Yes    Alcohol/week: 6.0 standard drinks    Types: 6 Standard drinks or equivalent per week   Drug use: No   Sexual activity: Yes    Partners: Female    Birth control/protection: Condom

## 2022-01-12 ENCOUNTER — Other Ambulatory Visit: Payer: Self-pay

## 2022-01-12 ENCOUNTER — Ambulatory Visit (INDEPENDENT_AMBULATORY_CARE_PROVIDER_SITE_OTHER): Payer: BC Managed Care – PPO | Admitting: Orthopaedic Surgery

## 2022-01-12 ENCOUNTER — Encounter: Payer: Self-pay | Admitting: Orthopaedic Surgery

## 2022-01-12 DIAGNOSIS — Z969 Presence of functional implant, unspecified: Secondary | ICD-10-CM

## 2022-01-12 NOTE — Progress Notes (Signed)
° °  Post-Op Visit Note   Patient: Jonathan Garcia           Date of Birth: June 14, 1986           MRN: [id number] Visit Date: 01/12/2022 PCP: Doristine Bosworth, MD   Assessment & Plan:  Chief Complaint:  Chief Complaint  Patient presents with   Right Knee - Follow-up    Hardware removal 12/15/2021   Visit Diagnoses:  1. Retained orthopedic hardware     Plan: Patient is 4 weeks status post hardware removal from right proximal tibia.  Overall doing well has no complaints.  Examination of the right knee shows fully healed surgical scar.  Excellent range of motion.  No joint effusion.  No tenderness to palpation.  [name] has done very well from his surgery.  We discussed that he should avoid impact activities such as running and basketball for 3 total months after surgery.  He can engage in any nonimpact activities such as swimming or stationary bike now if he does not have any discomfort.  Since he is doing so well we are just going to have him follow-up if he needs me.  Questions encouraged and answered.  Follow-up as needed.  Follow-Up Instructions: No follow-ups on file.   Orders:  No orders of the defined types were placed in this encounter.  No orders of the defined types were placed in this encounter.   Imaging: No results found.  PMFS History: Patient Active Problem List   Diagnosis Date Noted   Painful orthopaedic hardware (HCC) 12/15/2021   Vitamin D deficiency 03/23/2021   Anxiety    Blood transfusion declined because patient is Jehovah's Witness 03/22/2021   Closed fracture of right tibial plateau 03/22/2021   Social anxiety disorder 10/28/2018   Penile lesion 10/28/2018   Past Medical History:  Diagnosis Date   Anxiety    Blood transfusion declined because patient is Jehovah's Witness 03/22/2021   Closed fracture of right tibial plateau 03/22/2021   Patient is Jehovah's Witness     Family History  Problem Relation Age of Onset   Diabetes Father     Parkinson's disease Paternal Grandmother    Diabetes Paternal Grandmother     Past Surgical History:  Procedure Laterality Date   ORIF TIBIA PLATEAU Right 03/22/2021   Procedure: OPEN REDUCTION INTERNAL FIXATION (ORIF) RIGHT TIBIAL PLATEAU;  Surgeon: Myrene Galas, MD;  Location: [facility] OR;  Service: Orthopedics;  Laterality: Right;   Social History   Occupational History   Not on file  Tobacco Use   Smoking status: Never   Smokeless tobacco: Never  Vaping Use   Vaping Use: Never used  Substance and Sexual Activity   Alcohol use: Yes    Alcohol/week: 6.0 standard drinks    Types: 6 Standard drinks or equivalent per week   Drug use: No   Sexual activity: Yes    Partners: Female    Birth control/protection: Condom

## 2022-04-06 DIAGNOSIS — R140 Abdominal distension (gaseous): Secondary | ICD-10-CM | POA: Diagnosis not present

## 2022-12-05 DIAGNOSIS — E7800 Pure hypercholesterolemia, unspecified: Secondary | ICD-10-CM | POA: Diagnosis not present

## 2022-12-05 DIAGNOSIS — Z0000 Encounter for general adult medical examination without abnormal findings: Secondary | ICD-10-CM | POA: Diagnosis not present

## 2022-12-05 DIAGNOSIS — R0683 Snoring: Secondary | ICD-10-CM | POA: Diagnosis not present

## 2022-12-05 DIAGNOSIS — E669 Obesity, unspecified: Secondary | ICD-10-CM | POA: Diagnosis not present

## 2022-12-05 DIAGNOSIS — F4010 Social phobia, unspecified: Secondary | ICD-10-CM | POA: Diagnosis not present

## 2022-12-05 DIAGNOSIS — R1906 Epigastric swelling, mass or lump: Secondary | ICD-10-CM | POA: Diagnosis not present

## 2023-01-01 ENCOUNTER — Ambulatory Visit
Admission: RE | Admit: 2023-01-01 | Discharge: 2023-01-01 | Disposition: A | Discharge status: Home / Self Care | Payer: BC Managed Care – PPO | Source: Ambulatory Visit | Attending: Family Medicine | Admitting: Family Medicine

## 2023-01-01 VITALS — BP 160/99 | HR 77 | Temp 98.8°F | Resp 17

## 2023-01-01 DIAGNOSIS — J0100 Acute maxillary sinusitis, unspecified: Secondary | ICD-10-CM

## 2023-01-01 MED ORDER — AMOXICILLIN-POT CLAVULANATE 875-125 MG PO TABS: 1.0000 | ORAL_TABLET | Freq: Two times a day (BID) | ORAL | 0 refills | Status: AC
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
## 2023-01-01 NOTE — ED Triage Notes (Signed)
Pt presents with non productive cough and congestion X 4 days.

## 2023-01-01 NOTE — Discharge Instructions (Signed)
You were seen today for sinus infection.  I have sent out augmentin to take twice/day x 10 days.  I recommend you also take over the counter flonase, as well as claritin/zyrtec for sinus congestion.  Please return if not improving or worsening.
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
## 2023-03-13 IMAGING — DX DG KNEE 1-2V PORT*R*
2 series · 2 of 2 positions shown · IV contrast (agent unspecified)
Comparison: MRI right knee 03/16/2021.

CLINICAL DATA: Right.  Postoperative exam.

EXAM:
DG C-ARM 1-60 MIN; PORTABLE RIGHT KNEE - 1-2 VIEW
CONTRAST:  None.
FLUOROSCOPY TIME:  Fluoroscopy Time: 1 minutes 7 seconds. Radiation
dose: 2.4 mGy.

[knee ap]
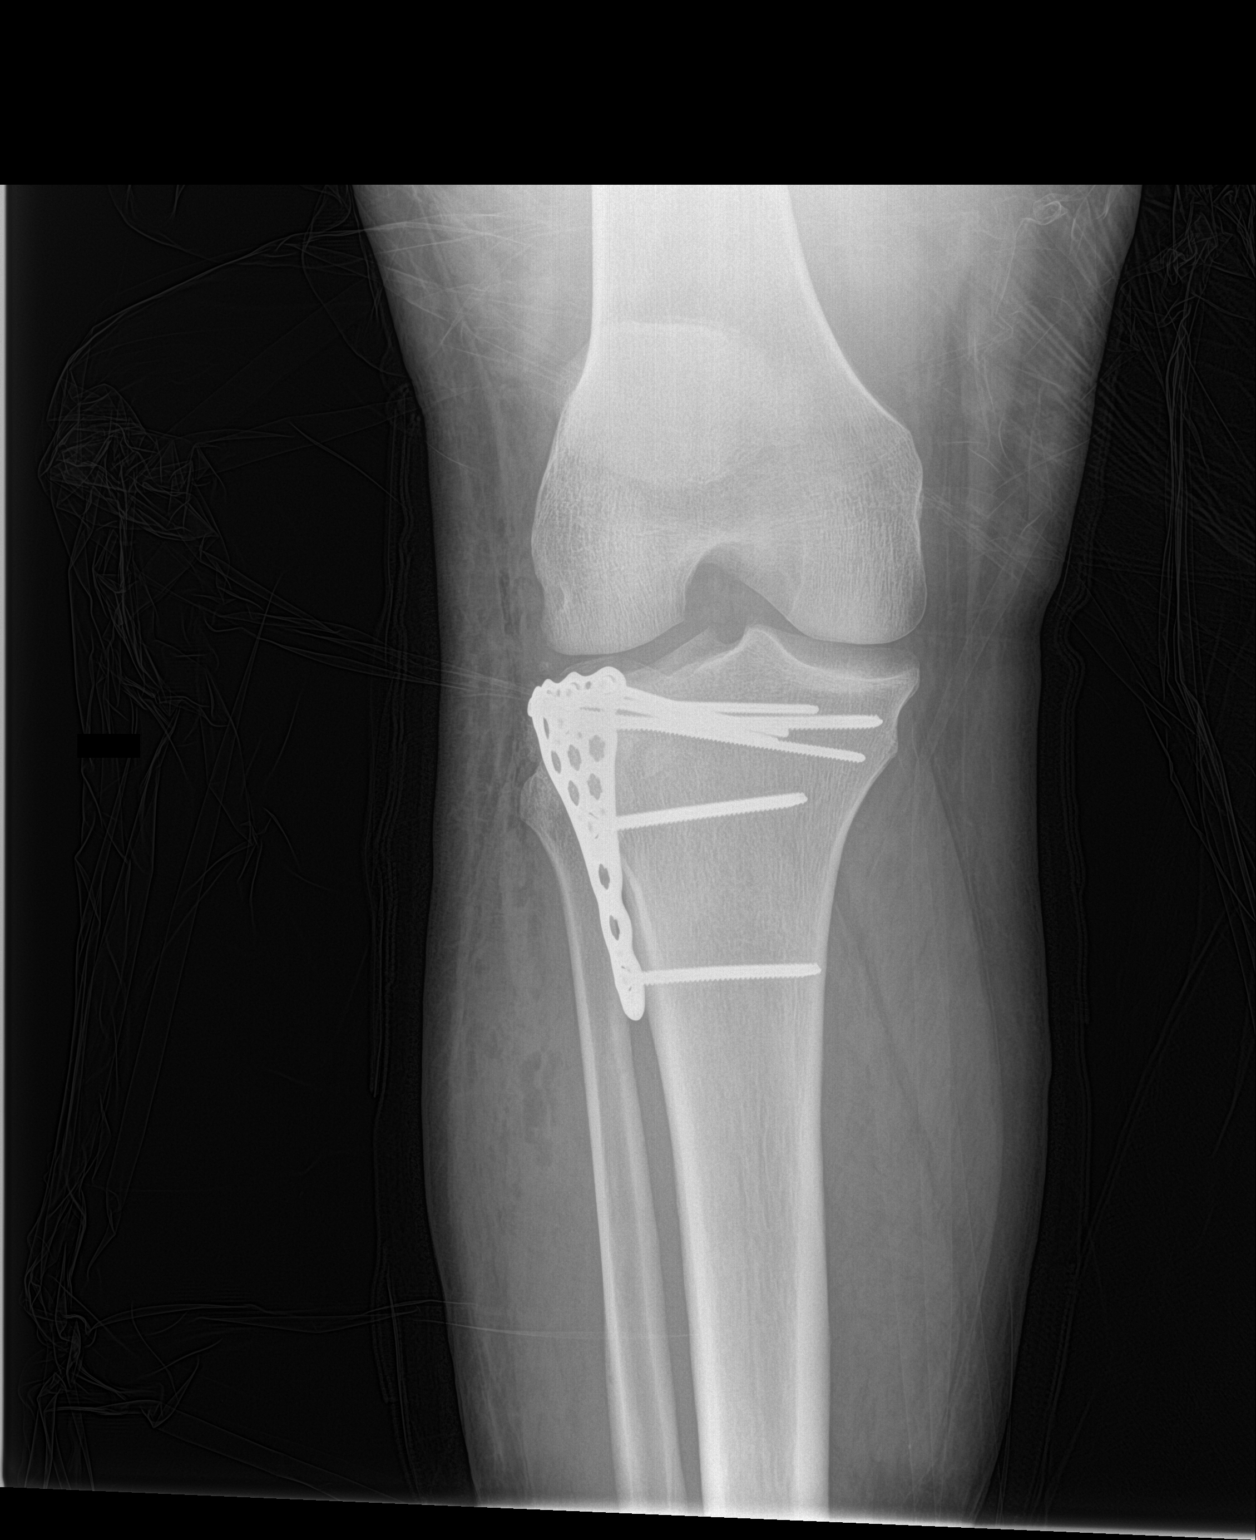

[knee lat]
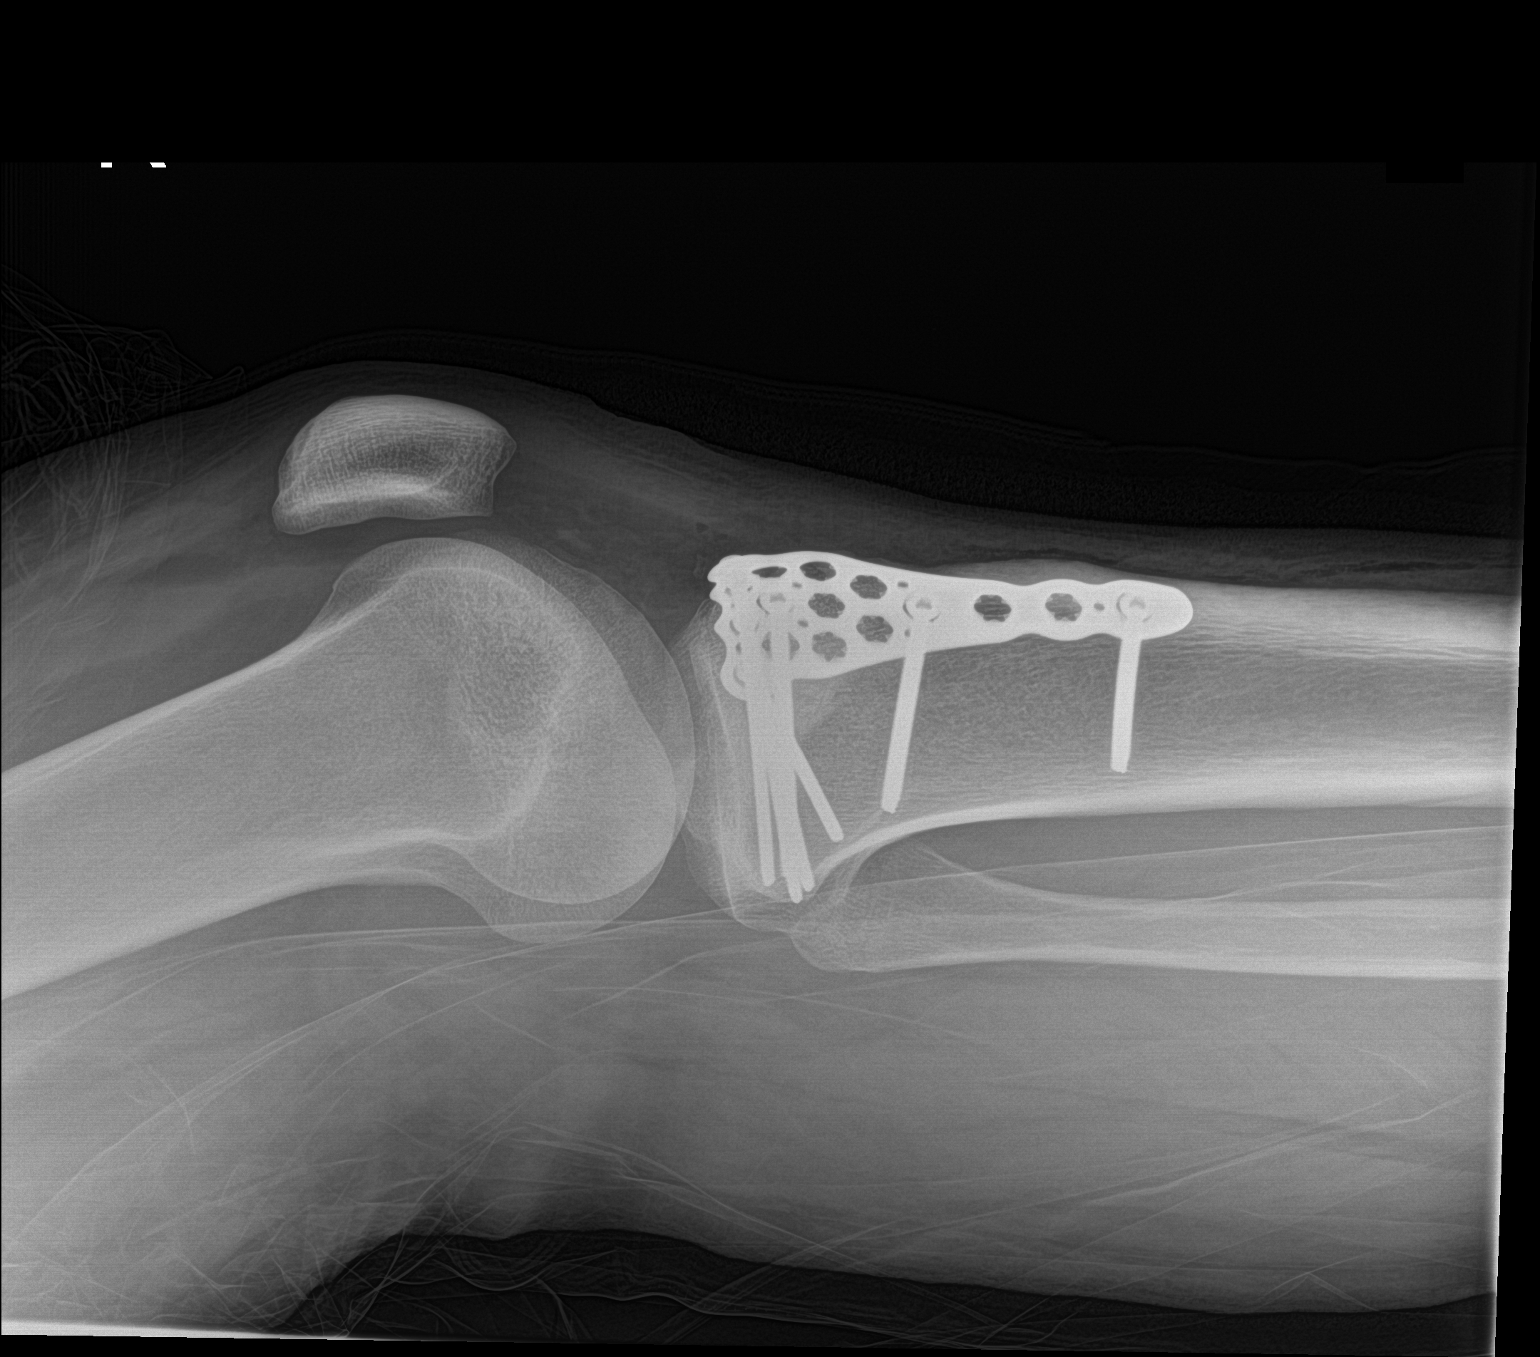

[2 of 2 positions shown; findings below may reference images not displayed]

FINDINGS: Plate screw fixation proximal tibia. Hardware intact. Anatomic
alignment.
IMPRESSION: Plate screw fixation proximal tibia.  Anatomic alignment.

## 2023-03-13 IMAGING — RF DG KNEE 1-2V*R*
1 series · 13 of 13 positions shown · non-contrast
Comparison: MRI 03/16/2021.

CLINICAL DATA: ORIF.

EXAM:
RIGHT KNEE - 1-2 VIEW

[Series 1: run · 13 of 13 slices shown]
[im 1/13]
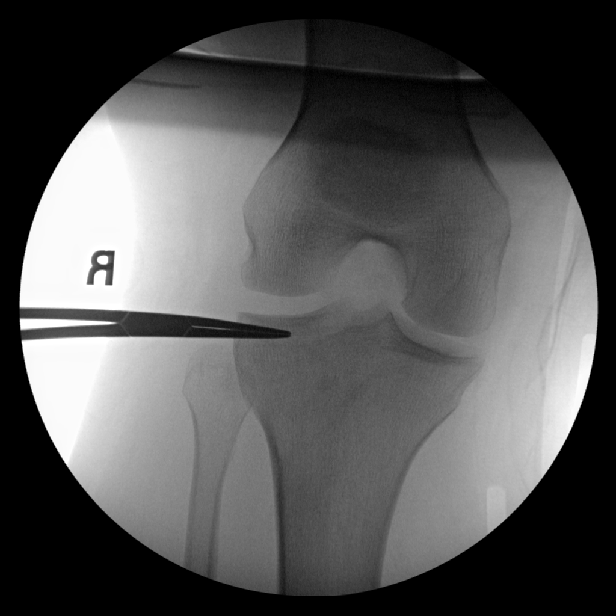
[im 2/13]
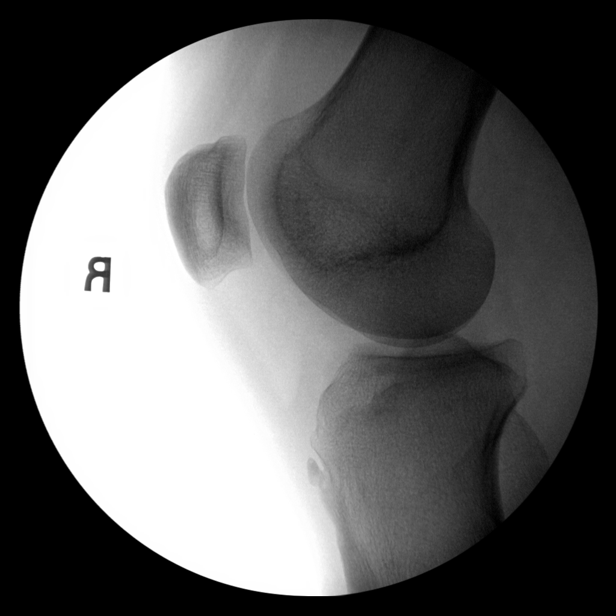
[im 3/13]
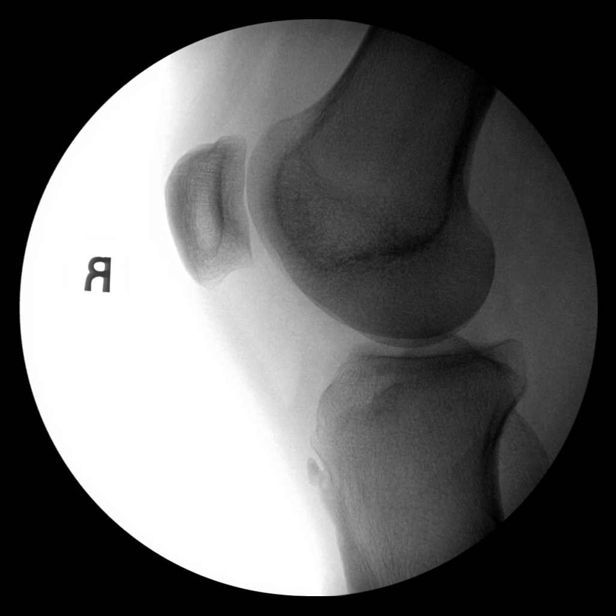
[im 4/13]
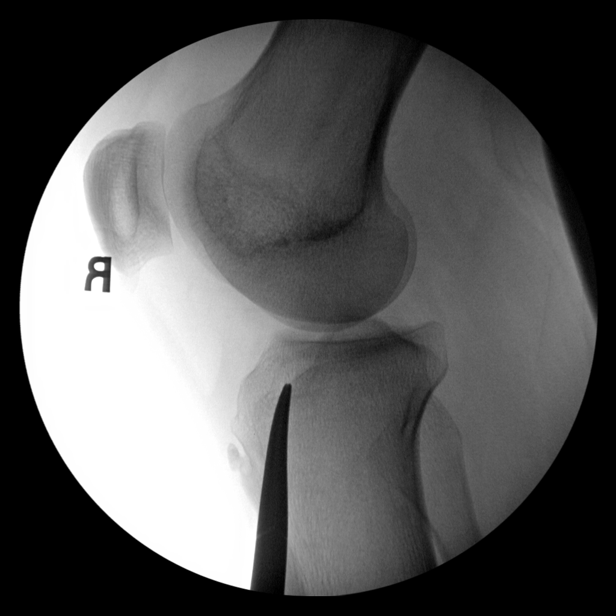
[im 5/13]
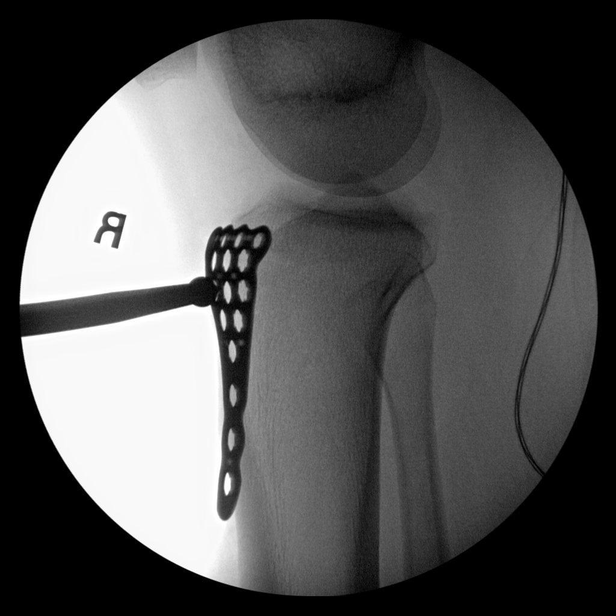
[im 6/13]
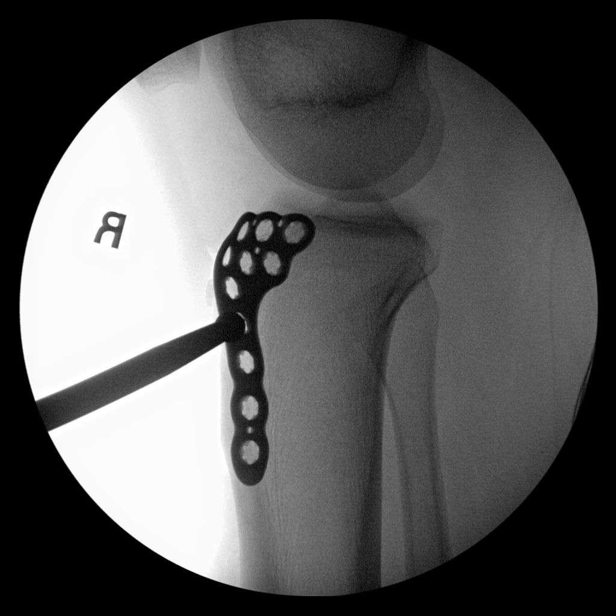
[im 7/13]
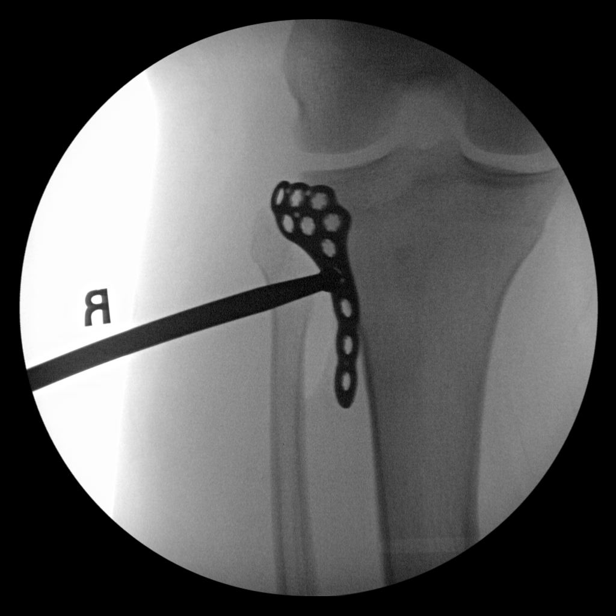
[im 8/13]
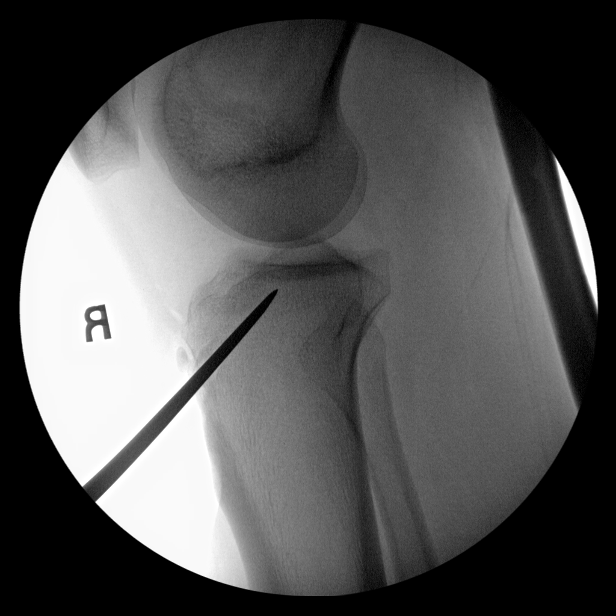
[im 9/13]
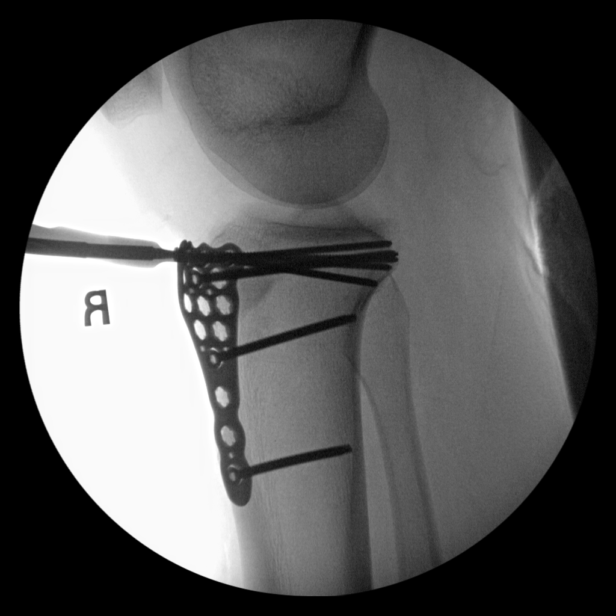
[im 10/13]
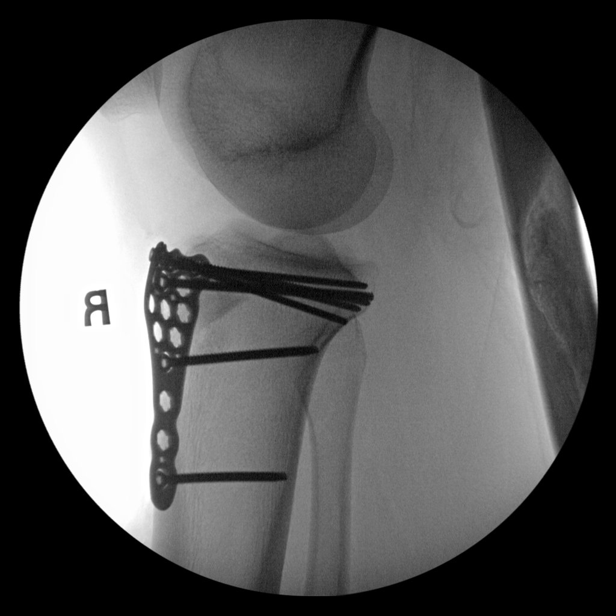
[im 11/13]
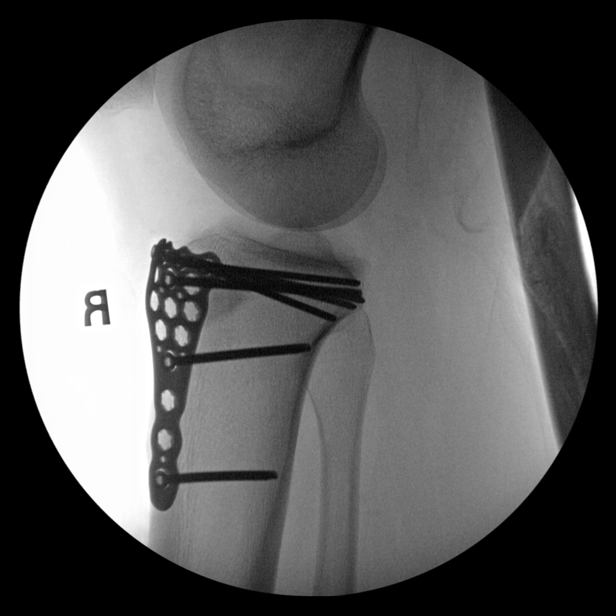
[im 12/13]
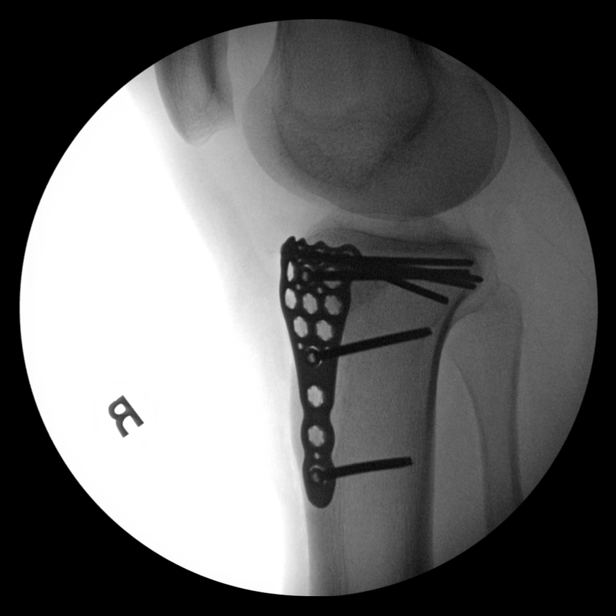
[im 13/13]
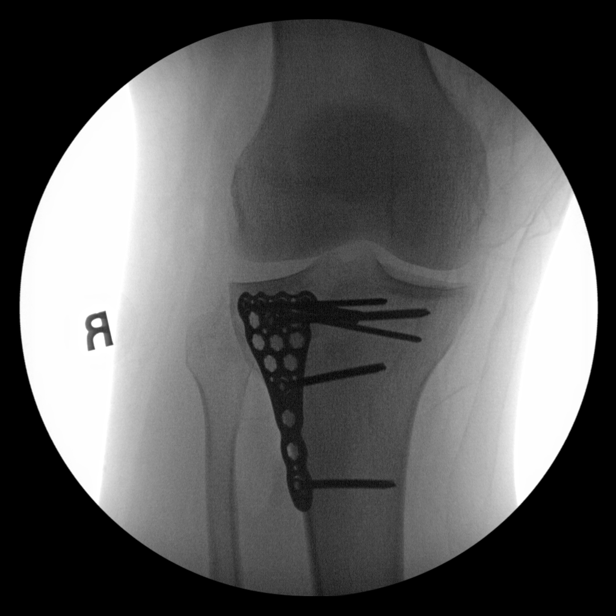

[13 of 13 positions shown; findings below may reference images not displayed]

FINDINGS: ORIF proximal tibia. Hardware intact. Anatomic alignment. 1 minutes
7 seconds fluoroscopy. 2.4 mGy radiation dose.
IMPRESSION: ORIF proximal tibia.  Anatomic alignment.

## 2023-06-14 DIAGNOSIS — R252 Cramp and spasm: Secondary | ICD-10-CM | POA: Diagnosis not present

## 2023-06-14 DIAGNOSIS — R748 Abnormal levels of other serum enzymes: Secondary | ICD-10-CM | POA: Diagnosis not present

## 2023-06-14 DIAGNOSIS — R6881 Early satiety: Secondary | ICD-10-CM | POA: Diagnosis not present

## 2023-06-14 DIAGNOSIS — R1013 Epigastric pain: Secondary | ICD-10-CM | POA: Diagnosis not present

## 2023-06-15 ENCOUNTER — Other Ambulatory Visit: Payer: Self-pay | Admitting: Internal Medicine

## 2023-06-15 DIAGNOSIS — R1013 Epigastric pain: Secondary | ICD-10-CM

## 2023-07-05 ENCOUNTER — Ambulatory Visit
Admission: RE | Admit: 2023-07-05 | Discharge: 2023-07-05 | Disposition: A | Discharge status: Home / Self Care | Payer: BC Managed Care – PPO | Source: Ambulatory Visit | Attending: Internal Medicine | Admitting: Internal Medicine

## 2023-07-05 DIAGNOSIS — R1012 Left upper quadrant pain: Secondary | ICD-10-CM | POA: Diagnosis not present

## 2023-07-05 DIAGNOSIS — R1013 Epigastric pain: Secondary | ICD-10-CM

## 2023-07-05 DIAGNOSIS — N281 Cyst of kidney, acquired: Secondary | ICD-10-CM | POA: Diagnosis not present

## 2023-07-05 DIAGNOSIS — R110 Nausea: Secondary | ICD-10-CM | POA: Diagnosis not present

## 2023-12-13 DIAGNOSIS — E7800 Pure hypercholesterolemia, unspecified: Secondary | ICD-10-CM | POA: Diagnosis not present

## 2023-12-13 DIAGNOSIS — Z0000 Encounter for general adult medical examination without abnormal findings: Secondary | ICD-10-CM | POA: Diagnosis not present

## 2024-02-11 ENCOUNTER — Other Ambulatory Visit (HOSPITAL_COMMUNITY): Payer: Self-pay
